# Patient Record
Sex: Female | Born: 1955
Health system: Southern US, Community
[De-identification: ages and names within clinical notes are randomized; demographics above are authoritative.]

## PROBLEM LIST (undated history)

## (undated) DIAGNOSIS — F329 Major depressive disorder, single episode, unspecified: Secondary | ICD-10-CM

## (undated) DIAGNOSIS — E785 Hyperlipidemia, unspecified: Secondary | ICD-10-CM

## (undated) DIAGNOSIS — G43909 Migraine, unspecified, not intractable, without status migrainosus: Secondary | ICD-10-CM

## (undated) DIAGNOSIS — M810 Age-related osteoporosis without current pathological fracture: Secondary | ICD-10-CM

## (undated) DIAGNOSIS — IMO0001 Reserved for inherently not codable concepts without codable children: Secondary | ICD-10-CM

## (undated) DIAGNOSIS — M8580 Other specified disorders of bone density and structure, unspecified site: Secondary | ICD-10-CM

## (undated) DIAGNOSIS — F419 Anxiety disorder, unspecified: Secondary | ICD-10-CM

## (undated) DIAGNOSIS — K219 Gastro-esophageal reflux disease without esophagitis: Secondary | ICD-10-CM

## (undated) DIAGNOSIS — F32A Depression, unspecified: Secondary | ICD-10-CM

## (undated) DIAGNOSIS — D219 Benign neoplasm of connective and other soft tissue, unspecified: Secondary | ICD-10-CM

## (undated) HISTORY — DX: Other specified disorders of bone density and structure, unspecified site: M85.80

## (undated) HISTORY — DX: Depression, unspecified: F32.A

## (undated) HISTORY — DX: Benign neoplasm of connective and other soft tissue, unspecified: D21.9

## (undated) HISTORY — DX: Gastro-esophageal reflux disease without esophagitis: K21.9

## (undated) HISTORY — DX: Migraine, unspecified, not intractable, without status migrainosus: G43.909

## (undated) HISTORY — DX: Anxiety disorder, unspecified: F41.9

## (undated) HISTORY — PX: PELVIC LAPAROSCOPY: SHX162

## (undated) HISTORY — DX: Hyperlipidemia, unspecified: E78.5

## (undated) HISTORY — DX: Age-related osteoporosis without current pathological fracture: M81.0

## (undated) HISTORY — PX: OVARIAN CYST REMOVAL: SHX89

## (undated) HISTORY — PX: DILATION AND CURETTAGE OF UTERUS: SHX78

## (undated) HISTORY — DX: Major depressive disorder, single episode, unspecified: F32.9

## (undated) HISTORY — PX: CARPAL TUNNEL RELEASE: SHX101

## (undated) HISTORY — DX: Reserved for inherently not codable concepts without codable children: IMO0001

---

## 1977-12-19 HISTORY — PX: WISDOM TOOTH EXTRACTION: SHX21

## 1998-08-20 ENCOUNTER — Ambulatory Visit (HOSPITAL_COMMUNITY): Admission: RE | Admit: 1998-08-20 | Discharge: 1998-08-20 | Payer: Self-pay | Admitting: Obstetrics and Gynecology

## 1999-03-04 ENCOUNTER — Other Ambulatory Visit: Admission: RE | Admit: 1999-03-04 | Discharge: 1999-03-04 | Payer: Self-pay | Admitting: Obstetrics and Gynecology

## 2000-06-07 ENCOUNTER — Other Ambulatory Visit: Admission: RE | Admit: 2000-06-07 | Discharge: 2000-06-07 | Payer: Self-pay | Admitting: Gynecology

## 2001-06-14 ENCOUNTER — Other Ambulatory Visit: Admission: RE | Admit: 2001-06-14 | Discharge: 2001-06-14 | Payer: Self-pay | Admitting: Gynecology

## 2002-07-04 ENCOUNTER — Other Ambulatory Visit: Admission: RE | Admit: 2002-07-04 | Discharge: 2002-07-04 | Payer: Self-pay | Admitting: Gynecology

## 2002-12-31 ENCOUNTER — Encounter: Admission: RE | Admit: 2002-12-31 | Discharge: 2002-12-31 | Payer: Self-pay | Admitting: Family Medicine

## 2002-12-31 ENCOUNTER — Encounter: Payer: Self-pay | Admitting: Family Medicine

## 2003-07-29 ENCOUNTER — Other Ambulatory Visit: Admission: RE | Admit: 2003-07-29 | Discharge: 2003-07-29 | Payer: Self-pay | Admitting: Gynecology

## 2004-07-30 ENCOUNTER — Other Ambulatory Visit: Admission: RE | Admit: 2004-07-30 | Discharge: 2004-07-30 | Payer: Self-pay | Admitting: Gynecology

## 2004-12-14 ENCOUNTER — Emergency Department (HOSPITAL_COMMUNITY): Admission: EM | Admit: 2004-12-14 | Discharge: 2004-12-14 | Payer: Self-pay | Admitting: Emergency Medicine

## 2005-08-12 ENCOUNTER — Other Ambulatory Visit: Admission: RE | Admit: 2005-08-12 | Discharge: 2005-08-12 | Payer: Self-pay | Admitting: Obstetrics and Gynecology

## 2005-08-29 ENCOUNTER — Ambulatory Visit: Payer: Self-pay | Admitting: Internal Medicine

## 2005-09-13 ENCOUNTER — Ambulatory Visit: Payer: Self-pay | Admitting: Internal Medicine

## 2006-09-19 ENCOUNTER — Other Ambulatory Visit: Admission: RE | Admit: 2006-09-19 | Discharge: 2006-09-19 | Payer: Self-pay | Admitting: Gynecology

## 2008-02-11 ENCOUNTER — Other Ambulatory Visit: Admission: RE | Admit: 2008-02-11 | Discharge: 2008-02-11 | Payer: Self-pay | Admitting: Family Medicine

## 2008-10-23 ENCOUNTER — Ambulatory Visit: Payer: Self-pay | Admitting: Women's Health

## 2008-11-06 ENCOUNTER — Ambulatory Visit: Payer: Self-pay | Admitting: Gynecology

## 2008-11-06 ENCOUNTER — Encounter: Payer: Self-pay | Admitting: Gynecology

## 2008-12-29 ENCOUNTER — Ambulatory Visit: Payer: Self-pay | Admitting: Women's Health

## 2009-04-27 ENCOUNTER — Ambulatory Visit: Payer: Self-pay | Admitting: Women's Health

## 2009-11-25 ENCOUNTER — Other Ambulatory Visit: Admission: RE | Admit: 2009-11-25 | Discharge: 2009-11-25 | Payer: Self-pay | Admitting: Gynecology

## 2009-11-25 ENCOUNTER — Ambulatory Visit: Payer: Self-pay | Admitting: Women's Health

## 2009-12-24 ENCOUNTER — Ambulatory Visit: Payer: Self-pay | Admitting: Women's Health

## 2010-02-01 ENCOUNTER — Ambulatory Visit: Payer: Self-pay | Admitting: Women's Health

## 2010-08-13 ENCOUNTER — Encounter: Payer: Self-pay | Admitting: Internal Medicine

## 2011-01-20 NOTE — Letter (Signed)
Summary: Colonoscopy Letter  [facility]  [address] [place], [place] [address]   Phone: [phone]  Fax: [phone]      August 13, 2010 MRN: [id number]   [facility] [address] [place], [place]  [address]   Dear Ms. Sedalia Muta,   According to your medical record, it is time for you to schedule a Colonoscopy. The American Cancer Society recommends this procedure as a method to detect early colon cancer. Patients with a family history of colon cancer, or a personal history of colon polyps or inflammatory bowel disease are at increased risk.  This letter has been generated based on the recommendations made at the time of your procedure. If you feel that in your particular situation this may no longer apply, please contact our office.  Please call our office at [phone] to schedule this appointment or to update your records at your earliest convenience.  Thank you for cooperating with [place] to provide you with the very best care possible.   Sincerely,  Wilhemina Bonito. Marina Goodell, M.D.  [facility] [phone]

## 2011-01-31 ENCOUNTER — Other Ambulatory Visit: Payer: Self-pay | Admitting: Women's Health

## 2011-01-31 ENCOUNTER — Other Ambulatory Visit (HOSPITAL_COMMUNITY)
Admission: RE | Admit: 2011-01-31 | Discharge: 2011-01-31 | Disposition: A | Discharge status: Home / Self Care | Payer: 59 | Source: Ambulatory Visit | Attending: Gynecology | Admitting: Gynecology

## 2011-01-31 ENCOUNTER — Encounter: Payer: 59 | Admitting: Women's Health

## 2011-01-31 DIAGNOSIS — Z124 Encounter for screening for malignant neoplasm of cervix: Secondary | ICD-10-CM

## 2011-01-31 DIAGNOSIS — N951 Menopausal and female climacteric states: Secondary | ICD-10-CM

## 2011-08-22 ENCOUNTER — Other Ambulatory Visit: Payer: Self-pay | Admitting: Women's Health

## 2011-08-24 NOTE — Telephone Encounter (Signed)
Michele [name], was her annual in February. If so we renew Wellbutrin 150 for 6 months.  Thanks.

## 2011-12-20 HISTORY — PX: COLONOSCOPY: SHX174

## 2012-02-01 ENCOUNTER — Encounter: Payer: Self-pay | Admitting: Women's Health

## 2012-02-02 ENCOUNTER — Other Ambulatory Visit: Payer: Self-pay | Admitting: *Deleted

## 2012-02-02 DIAGNOSIS — R928 Other abnormal and inconclusive findings on diagnostic imaging of breast: Secondary | ICD-10-CM

## 2012-02-03 ENCOUNTER — Other Ambulatory Visit: Payer: Self-pay | Admitting: Women's Health

## 2012-02-03 DIAGNOSIS — R928 Other abnormal and inconclusive findings on diagnostic imaging of breast: Secondary | ICD-10-CM

## 2012-02-29 ENCOUNTER — Ambulatory Visit (INDEPENDENT_AMBULATORY_CARE_PROVIDER_SITE_OTHER): Payer: 59 | Admitting: Women's Health

## 2012-02-29 ENCOUNTER — Encounter: Payer: Self-pay | Admitting: Women's Health

## 2012-02-29 ENCOUNTER — Other Ambulatory Visit (HOSPITAL_COMMUNITY)
Admission: RE | Admit: 2012-02-29 | Discharge: 2012-02-29 | Disposition: A | Discharge status: Home / Self Care | Payer: 59 | Source: Ambulatory Visit | Attending: Obstetrics and Gynecology | Admitting: Obstetrics and Gynecology

## 2012-02-29 VITALS — BP 110/72 | Ht 63.0 in | Wt 141.0 lb

## 2012-02-29 DIAGNOSIS — Z8669 Personal history of other diseases of the nervous system and sense organs: Secondary | ICD-10-CM | POA: Insufficient documentation

## 2012-02-29 DIAGNOSIS — Z01419 Encounter for gynecological examination (general) (routine) without abnormal findings: Secondary | ICD-10-CM | POA: Insufficient documentation

## 2012-02-29 DIAGNOSIS — Z833 Family history of diabetes mellitus: Secondary | ICD-10-CM

## 2012-02-29 DIAGNOSIS — Z13220 Encounter for screening for lipoid disorders: Secondary | ICD-10-CM

## 2012-02-29 DIAGNOSIS — F419 Anxiety disorder, unspecified: Secondary | ICD-10-CM | POA: Insufficient documentation

## 2012-02-29 DIAGNOSIS — Z780 Asymptomatic menopausal state: Secondary | ICD-10-CM

## 2012-02-29 DIAGNOSIS — F341 Dysthymic disorder: Secondary | ICD-10-CM

## 2012-02-29 DIAGNOSIS — N951 Menopausal and female climacteric states: Secondary | ICD-10-CM

## 2012-02-29 DIAGNOSIS — F329 Major depressive disorder, single episode, unspecified: Secondary | ICD-10-CM

## 2012-02-29 DIAGNOSIS — F32A Depression, unspecified: Secondary | ICD-10-CM

## 2012-02-29 MED ORDER — MEDROXYPROGESTERONE ACETATE 2.5 MG PO TABS: 2.5000 mg | ORAL_TABLET | Freq: Every day | ORAL | Status: DC

## 2012-02-29 MED ORDER — ESTRADIOL 0.5 MG PO TABS: 0.5000 mg | ORAL_TABLET | Freq: Every day | ORAL | Status: DC

## 2012-02-29 NOTE — Progress Notes (Signed)
Michele [name] 07-06-1956 [id number]    History:    The patient presents for annual exam.  Postmenopausal with no bleeding on estradiol 0.5 and Provera 2.5 daily with good relief of symptoms. History of anxiety and depression stable on Wellbutrin 150.` History of normal Paps, Pap 2012 absent endocervical cells. History of normal mammograms. Normal colonoscopy. Normal DEXA in 2010.   Past medical history, past surgical history, family history and social history were all reviewed and documented in the EPIC chart. Son Ebony Cargo age 2 and doing well. Both parents in 39s with numerous health problems. History of increased cholesterol in the past, was on Crestor, last lipid profiles normal.   ROS:  A  ROS was performed and pertinent positives and negatives are included in the history.  Exam:  Filed Vitals:   02/29/12 1531  BP: 110/72    General appearance:  Normal Head/Neck:  Normal, without cervical or supraclavicular adenopathy. Thyroid:  Symmetrical, normal in size, without palpable masses or nodularity. Respiratory  Effort:  Normal  Auscultation:  Clear without wheezing or rhonchi Cardiovascular  Auscultation:  Regular rate, without rubs, murmurs or gallops  Edema/varicosities:  Not grossly evident Abdominal  Soft,nontender, without masses, guarding or rebound.  Liver/spleen:  No organomegaly noted  Hernia:  None appreciated  Skin  Inspection:  Grossly normal  Palpation:  Grossly normal Neurologic/psychiatric  Orientation:  Normal with appropriate conversation.  Mood/affect:  Normal  Genitourinary    Breasts: Examined lying and sitting.     Right: Without masses, retractions, discharge or axillary adenopathy.     Left: Without masses, retractions, discharge or axillary adenopathy.   Inguinal/mons:  Normal without inguinal adenopathy  External genitalia:  Normal  BUS/Urethra/Skene's glands:  Normal  Bladder:  Normal  Vagina:  Normal  Cervix:  Normal  Uterus:   normal in  size, shape and contour.  Midline and mobile  Adnexa/parametria:     Rt: Without masses or tenderness.   Lt: Without masses or tenderness.  Anus and perineum: Normal  Digital rectal exam: Normal sphincter tone without palpated masses or tenderness  Assessment/Plan:  56 y.o. DW F. G2 P1  for annual exam.  Normal postmenopausal exam on HRT Anxiety/depression-stable on Wellbutrin  Plan: Estradiol 0.5, Provera 2.5 daily prescriptions, proper use, slight risk for blood clots, strokes, breast cancer reviewed. SBE's, annual mammogram, calcium rich diet, vitamin D 2000 daily and exercise encouraged. CBC, glucose, lipid profile, UA and Pap. DEXA next year.     Harrington Challenger [facility], 5:45 PM 02/29/2012

## 2012-02-29 NOTE — Patient Instructions (Signed)

## 2012-03-01 LAB — URINALYSIS W MICROSCOPIC + REFLEX CULTURE
Bacteria, UA: NONE SEEN
Bilirubin Urine: NEGATIVE
Protein, ur: NEGATIVE mg/dL
Urobilinogen, UA: 0.2 mg/dL (ref 0.0–1.0)

## 2012-03-01 LAB — CBC WITH DIFFERENTIAL/PLATELET
HCT: 40.4 % (ref 36.0–46.0)
Hemoglobin: 13.4 g/dL (ref 12.0–15.0)
Lymphocytes Relative: 34 % (ref 12–46)
Lymphs Abs: 1.9 10*3/uL (ref 0.7–4.0)
Monocytes Absolute: 0.5 10*3/uL (ref 0.1–1.0)
Monocytes Relative: 10 % (ref 3–12)
Neutro Abs: 3.1 10*3/uL (ref 1.7–7.7)
WBC: 5.6 10*3/uL (ref 4.0–10.5)

## 2012-03-01 LAB — LIPID PANEL
Cholesterol: 141 mg/dL (ref 0–200)
Total CHOL/HDL Ratio: 3.4 Ratio
Triglycerides: 79 mg/dL (ref <150)
VLDL: 16 mg/dL (ref 0–40)

## 2012-08-08 ENCOUNTER — Encounter (HOSPITAL_COMMUNITY): Payer: Self-pay | Admitting: Emergency Medicine

## 2012-08-08 ENCOUNTER — Emergency Department (HOSPITAL_COMMUNITY)
Admission: EM | Admit: 2012-08-08 | Discharge: 2012-08-09 | Disposition: A | Discharge status: Home / Self Care | Payer: 59 | Attending: Emergency Medicine | Admitting: Emergency Medicine

## 2012-08-08 ENCOUNTER — Emergency Department (HOSPITAL_COMMUNITY): Payer: 59

## 2012-08-08 DIAGNOSIS — R42 Dizziness and giddiness: Secondary | ICD-10-CM | POA: Insufficient documentation

## 2012-08-08 DIAGNOSIS — F341 Dysthymic disorder: Secondary | ICD-10-CM | POA: Insufficient documentation

## 2012-08-08 DIAGNOSIS — R55 Syncope and collapse: Secondary | ICD-10-CM | POA: Insufficient documentation

## 2012-08-08 LAB — URINALYSIS, ROUTINE W REFLEX MICROSCOPIC
Glucose, UA: NEGATIVE mg/dL
Ketones, ur: NEGATIVE mg/dL
pH: 6 (ref 5.0–8.0)

## 2012-08-08 LAB — COMPREHENSIVE METABOLIC PANEL
ALT: 17 U/L (ref 0–35)
Albumin: 4.1 g/dL (ref 3.5–5.2)
Alkaline Phosphatase: 81 U/L (ref 39–117)
BUN: 18 mg/dL (ref 6–23)
Potassium: 3.8 mEq/L (ref 3.5–5.1)
Sodium: 140 mEq/L (ref 135–145)
Total Protein: 6.6 g/dL (ref 6.0–8.3)

## 2012-08-08 LAB — CBC WITH DIFFERENTIAL/PLATELET
Basophils Relative: 1 % (ref 0–1)
Eosinophils Absolute: 0.1 10*3/uL (ref 0.0–0.7)
MCH: 29.4 pg (ref 26.0–34.0)
MCHC: 34.1 g/dL (ref 30.0–36.0)
Neutrophils Relative %: 73 % (ref 43–77)
Platelets: 218 10*3/uL (ref 150–400)

## 2012-08-08 LAB — URINE MICROSCOPIC-ADD ON

## 2012-08-08 LAB — GLUCOSE, CAPILLARY

## 2012-08-08 MED ORDER — SODIUM CHLORIDE 0.9 % IV SOLN
INTRAVENOUS | Status: DC
  Administered 2012-08-08: 22:00:00 via INTRAVENOUS

## 2012-08-08 NOTE — ED Provider Notes (Signed)
History     CSN: [id number]  Arrival date & time 08/08/12  5284   First MD Initiated Contact with Patient 08/08/12 2040      Chief Complaint  Patient presents with  . Dizziness    (Consider location/radiation/quality/duration/timing/severity/associated sxs/prior treatment) HPI  Patient presents to the ER from a restaurant for near syncope. She states that she was sitting at the table, had had a half glass of wine when her vision started to have black dots, she felt light headed, noises were distant, her hands went into a rigid spasm, she became diaphoretic and felt as though she was going to pass out. She states that this happened two weeks ago while at a restaurant after eating and having a glass of wine. The past episode was associated with vomiting and diarrhea, so she felt as though it were food poisoning. She denies having any problems drinking wine in the past. She denies a family history of cardiac or strokes. She is otherwise healthy and no longer has migraines since going through menopause. She denies talking a bout something evoking anxiety or feeling anxious, or focal weakness. She denies taking any medications. NAD and VSS  Past Medical History  Diagnosis Date  . Anxiety   . Depression   . Reflux   . Migraines   . Endometriosis   . Fibroid     Past Surgical History  Procedure Date  . Cesarean section   . Dilation and curettage of uterus     4 0R 5 DONE SINCE HER EARLY 20'S FOR ENDOMETRIOSIS  . Pelvic laparoscopy     EXPLORATORY   . Pelvic laparoscopy     FOR ECTOPIC    Family History  Problem Relation Age of Onset  . Cancer Maternal Grandmother     COLON    History  Substance Use Topics  . Smoking status: Former Games developer  . Smokeless tobacco: Never Used  . Alcohol Use: Yes     RARE    OB History    Grav Para Term Preterm Abortions TAB SAB Ect Mult Living   2 1   1   1         Review of Systems   HEENT: denies blurry vision or change in  hearing PULMONARY: Denies difficulty breathing and SOB CARDIAC: denies chest pain or heart palpitations MUSCULOSKELETAL:  denies being unable to ambulate ABDOMEN AL: denies abdominal pain GU: denies loss of bowel or urinary control NEURO: denies numbness and tingling in extremities SKIN: no new rashes PSYCH: patient denies anxiety or depression. NECK: Pt denies having neck pain     Allergies  Ivp dye  Home Medications  No current outpatient prescriptions on file.  BP 120/78  Pulse 72  Temp 98 F (36.7 C) (Oral)  Resp 16  SpO2 100%  Physical Exam  Nursing note and vitals reviewed. Constitutional: She appears well-developed and well-nourished. No distress.  HENT:  Head: Normocephalic and atraumatic.  Eyes: Pupils are equal, round, and reactive to light.  Neck: Normal range of motion. Neck supple.  Cardiovascular: Normal rate and regular rhythm.   Pulmonary/Chest: Effort normal.  Abdominal: Soft.  Neurological: She is alert. No cranial nerve deficit (3-12 intact). GCS eye subscore is 4. GCS verbal subscore is 5. GCS motor subscore is 6.  Skin: Skin is warm and dry.    ED Course  Procedures (including critical care time)  Labs Reviewed  URINALYSIS, ROUTINE W REFLEX MICROSCOPIC - Abnormal; Notable for the following:  Leukocytes, UA TRACE (*)     All other components within normal limits  URINE MICROSCOPIC-ADD ON - Abnormal; Notable for the following:    Squamous Epithelial / LPF FEW (*)     All other components within normal limits  GLUCOSE, CAPILLARY  TROPONIN I  CBC WITH DIFFERENTIAL  COMPREHENSIVE METABOLIC PANEL   Dg Chest 2 View  08/08/2012  *RADIOLOGY REPORT*  Clinical Data: Near syncope, prior smoker  CHEST - 2 VIEW  Comparison: None.  Findings: Cardiomediastinal silhouette is within normal limits. The lungs are clear. No pleural effusion.  No pneumothorax.  No acute osseous abnormality.  IMPRESSION: Normal chest.   Original Report Authenticated By:  Harrel Lemon, M.D.    Ct Head Wo Contrast  08/08/2012  *RADIOLOGY REPORT*  Clinical Data:  Syncope  CT HEAD WITHOUT CONTRAST  Technique:  Contiguous axial images were obtained from the base of the skull through the vertex without contrast  Comparison:  None.  Findings:  The brain has a normal appearance without evidence for hemorrhage, acute infarction, hydrocephalus, or mass lesion.  There is no extra axial fluid collection.  The skull and paranasal sinuses are normal.  IMPRESSION: Normal CT of the head without contrast.   Original Report Authenticated By: Camelia Phenes, M.D.      1. Dizziness - light-headed       MDM   Date: 08/08/2012  Rate: 69  Rhythm: sinus tachycardia  QRS Axis: normal  Intervals: normal  ST/T Wave abnormalities: normal  Conduction Disutrbances:none  Narrative Interpretation:   Old EKG Reviewed: unchanged  Pt wrk up has returned unremarkable. Orthostatics normal Pt has a hx of depression, perhaps this was anxiety based. Not suspecting TIA. Dr. Effie Shy has seen patient and agrees she can follow-up with PCP for further work-up an evaluation.  Pt has been advised of the symptoms that warrant their return to the ED. Patient has voiced understanding and has agreed to follow-up with the PCP or specialist.         Dorthula Matas, PA 08/09/12 [phone]

## 2012-08-08 NOTE — ED Notes (Signed)
Pt alert, arrives from home, c/o dizziness, "cold sweats", nausea, onset was today while eating dinner, pt ambulates to triage, speech clear, denies H/A, resp even unlabored, skin pwd

## 2012-08-09 NOTE — ED Provider Notes (Signed)
Cleota Pellerito Kliewer is a 56 y.o. female with sudden onset of dizziness. Cold sweats, nausea, and bilateral carpal pedal spasm with tingling in both hands, while drinking wine. A similar episode occurred 2 weeks ago, but was associated with vomiting. No interval symptoms. No associated headache, chest pain, back pain, confusion, or suspected stress. Patient's husband was with her during the episode. He is here in the emergency department attributes to history. Exam she is mildly anxious. She is alert. She is cooperative. Neurologic exam is nonfocal. Respiratory effort is normal. She moves all extremities equally.   Medical screening examination/treatment/procedure(s) were conducted as a shared visit with non-physician practitioner(s) and myself.  I personally evaluated the patient during the encounter  Flint Melter, MD 08/09/12 [phone]

## 2012-08-13 ENCOUNTER — Other Ambulatory Visit: Payer: Self-pay | Admitting: Women's Health

## 2012-08-13 ENCOUNTER — Other Ambulatory Visit: Payer: Self-pay | Admitting: Family Medicine

## 2012-08-13 ENCOUNTER — Ambulatory Visit
Admission: RE | Admit: 2012-08-13 | Discharge: 2012-08-13 | Disposition: A | Discharge status: Home / Self Care | Payer: No Typology Code available for payment source | Source: Ambulatory Visit | Attending: Family Medicine | Admitting: Family Medicine

## 2012-08-13 DIAGNOSIS — M542 Cervicalgia: Secondary | ICD-10-CM

## 2012-08-13 DIAGNOSIS — R42 Dizziness and giddiness: Secondary | ICD-10-CM

## 2012-08-13 NOTE — Progress Notes (Signed)
Telephone call for followup of dizziness was seen in the ER on 8/21.Had a normal CT of the head, normal EKG and labs. Will  come to office for a TSH. Reviewed if dizziness persists we'll refer to neurologist. States no further dizziness.

## 2012-08-21 ENCOUNTER — Encounter: Payer: Self-pay | Admitting: Gastroenterology

## 2012-08-22 ENCOUNTER — Other Ambulatory Visit: Payer: 59

## 2012-08-22 ENCOUNTER — Telehealth: Payer: Self-pay | Admitting: *Deleted

## 2012-08-22 DIAGNOSIS — R42 Dizziness and giddiness: Secondary | ICD-10-CM

## 2012-08-22 LAB — THYROID PANEL WITH TSH
T4, Total: 8.3 ug/dL (ref 5.0–12.5)
TSH: 1.508 u[IU]/mL (ref 0.350–4.500)

## 2012-08-22 NOTE — Telephone Encounter (Signed)
okay

## 2012-08-22 NOTE — Telephone Encounter (Signed)
Lab informed.

## 2012-08-22 NOTE — Telephone Encounter (Signed)
Pt came in today for a TSH that [place] ordered due to dizziness. When the patient came in today she asked for a full thyroid panel. You are her back up physician since Harriett Sine isnt here I am asking you. Is this ok?

## 2012-08-22 NOTE — Addendum Note (Signed)
Addended by: Richardson Chiquito on: 08/22/2012 12:49 PM   Modules accepted: Orders

## 2012-09-04 ENCOUNTER — Encounter: Payer: Self-pay | Admitting: Internal Medicine

## 2012-09-04 ENCOUNTER — Other Ambulatory Visit: Payer: Self-pay | Admitting: Family Medicine

## 2012-09-04 DIAGNOSIS — M542 Cervicalgia: Secondary | ICD-10-CM

## 2012-09-07 ENCOUNTER — Other Ambulatory Visit: Payer: Self-pay | Admitting: Women's Health

## 2012-09-11 ENCOUNTER — Encounter: Payer: Self-pay | Admitting: Internal Medicine

## 2012-09-12 ENCOUNTER — Ambulatory Visit
Admission: RE | Admit: 2012-09-12 | Discharge: 2012-09-12 | Disposition: A | Discharge status: Home / Self Care | Payer: 59 | Source: Ambulatory Visit | Attending: Family Medicine | Admitting: Family Medicine

## 2012-09-12 DIAGNOSIS — M542 Cervicalgia: Secondary | ICD-10-CM

## 2012-09-28 ENCOUNTER — Ambulatory Visit (AMBULATORY_SURGERY_CENTER): Payer: 59 | Admitting: *Deleted

## 2012-09-28 ENCOUNTER — Encounter: Payer: Self-pay | Admitting: Gastroenterology

## 2012-09-28 VITALS — Ht 63.0 in | Wt 140.0 lb

## 2012-09-28 DIAGNOSIS — Z1211 Encounter for screening for malignant neoplasm of colon: Secondary | ICD-10-CM

## 2012-09-28 MED ORDER — MOVIPREP 100 G PO SOLR: ORAL | Status: DC

## 2012-10-10 ENCOUNTER — Ambulatory Visit (AMBULATORY_SURGERY_CENTER): Payer: 59 | Admitting: Gastroenterology

## 2012-10-10 ENCOUNTER — Encounter: Payer: Self-pay | Admitting: Gastroenterology

## 2012-10-10 VITALS — BP 103/67 | HR 62 | Temp 96.9°F | Resp 20 | Ht 63.0 in | Wt 140.0 lb

## 2012-10-10 DIAGNOSIS — Z1211 Encounter for screening for malignant neoplasm of colon: Secondary | ICD-10-CM

## 2012-10-10 MED ORDER — SODIUM CHLORIDE 0.9 % IV SOLN: 500.0000 mL | INTRAVENOUS | Status: DC

## 2012-10-10 NOTE — Patient Instructions (Addendum)
YOU HAD AN ENDOSCOPIC PROCEDURE TODAY AT THE [facility]: Refer to the procedure report that was given to you for any specific questions about what was found during the examination.  If the procedure report does not answer your questions, please call your gastroenterologist to clarify.  If you requested that your care partner not be given the details of your procedure findings, then the procedure report has been included in a sealed envelope for you to review at your convenience later.  YOU SHOULD EXPECT: Some feelings of bloating in the abdomen. Passage of more gas than usual.  Walking can help get rid of the air that was put into your GI tract during the procedure and reduce the bloating. If you had a lower endoscopy (such as a colonoscopy or flexible sigmoidoscopy) you may notice spotting of blood in your stool or on the toilet paper. If you underwent a bowel prep for your procedure, then you may not have a normal bowel movement for a few days.  DIET: Your first meal following the procedure should be a light meal and then it is ok to progress to your normal diet.  A half-sandwich or bowl of soup is an example of a good first meal.  Heavy or fried foods are harder to digest and may make you feel nauseous or bloated.  Likewise meals heavy in dairy and vegetables can cause extra gas to form and this can also increase the bloating.  Drink plenty of fluids but you should avoid alcoholic beverages for 24 hours.  ACTIVITY: Your care partner should take you home directly after the procedure.  You should plan to take it easy, moving slowly for the rest of the day.  You can resume normal activity the day after the procedure however you should NOT DRIVE or use heavy machinery for 24 hours (because of the sedation medicines used during the test).    SYMPTOMS TO REPORT IMMEDIATELY: A gastroenterologist can be reached at any hour.  During normal business hours, 8:30 AM to 5:00 PM Monday through Friday,  call [phone].  After hours and on weekends, please call the GI answering service at [phone] who will take a message and have the physician on call contact you.   Following lower endoscopy (colonoscopy or flexible sigmoidoscopy):  Excessive amounts of blood in the stool  Significant tenderness or worsening of abdominal pains  Swelling of the abdomen that is new, acute  Fever of 100F or higher    FOLLOW UP: If any biopsies were taken you will be contacted by phone or by letter within the next 1-3 weeks.  Call your gastroenterologist if you have not heard about the biopsies in 3 weeks.  Our staff will call the home number listed on your records the next business day following your procedure to check on you and address any questions or concerns that you may have at that time regarding the information given to you following your procedure. This is a courtesy call and so if there is no answer at the home number and we have not heard from you through the emergency physician on call, we will assume that you have returned to your regular daily activities without incident.  SIGNATURES/CONFIDENTIALITY: You and/or your care partner have signed paperwork which will be entered into your electronic medical record.  These signatures attest to the fact that that the information above on your After Visit Summary has been reviewed and is understood.  Full responsibility of the confidentiality   of this discharge information lies with you and/or your care-partner.     

## 2012-10-10 NOTE — Progress Notes (Signed)
Patient did not experience any of the following events: a burn prior to discharge; a fall within the facility; wrong site/side/patient/procedure/implant event; or a hospital transfer or hospital admission upon discharge from the facility. (G8907) Patient did not have preoperative order for IV antibiotic SSI prophylaxis. (G8918)  

## 2012-10-10 NOTE — Op Note (Signed)
 [facility] [address] [place], [address]   COLONOSCOPY PROCEDURE REPORT  PATIENT: Michele [name].  MR#: [id number] BIRTHDATE: 05/31/1956 , 56  yrs. old GENDER: Female ENDOSCOPIST: Rachael Fee, MD PROCEDURE DATE:  10/10/2012 PROCEDURE:   Colonoscopy, diagnostic ASA CLASS:   Class III INDICATIONS:grandparent had colon cancer; colonoscopy 2006 with Dr. Marina Goodell; normal, was recommeded to have repeat at 5 years for family history of colon cancer. MEDICATIONS: Fentanyl 75 mcg IV, Versed 8 mg IV, and These medications were titrated to patient response per physician's verbal order  DESCRIPTION OF PROCEDURE:   After the risks benefits and alternatives of the procedure were thoroughly explained, informed consent was obtained.  A digital rectal exam revealed no abnormalities of the rectum.   The LB [id number]  endoscope was introduced through the anus and advanced to the cecum, which was identified by both the appendix and ileocecal valve. No adverse events experienced.   The quality of the prep was good, using MoviPrep  The instrument was then slowly withdrawn as the colon was fully examined.   COLON FINDINGS: A normal appearing cecum, ileocecal valve, and appendiceal orifice were identified.  The ascending, hepatic flexure, transverse, splenic flexure, descending, sigmoid colon and rectum appeared unremarkable.  No polyps or cancers were seen. Retroflexed views revealed no abnormalities. The time to cecum=3 minutes 40 seconds.  Withdrawal time=6 minutes 08 seconds.  The scope was withdrawn and the procedure completed. COMPLICATIONS: There were no complications.  ENDOSCOPIC IMPRESSION: Normal colon No polyps or cancers  RECOMMENDATIONS: You should continue to follow colorectal cancer screening guidelines for "routine risk" patients with a repeat colonoscopy in 10 years. There is no need for FOBT (stool) testing for at least 5 years.   eSigned:   Rachael Fee, MD 10/10/2012 10:39 AM   cc: Maryelizabeth Rowan, MD

## 2012-10-11 ENCOUNTER — Telehealth: Payer: Self-pay | Admitting: *Deleted

## 2012-10-11 NOTE — Telephone Encounter (Signed)
  Follow up Call-  Call back number 10/10/2012  Post procedure Call Back phone  # [phone]  Permission to leave phone message Yes     Patient questions:  Do you have a fever, pain , or abdominal swelling? no Pain Score  0 *  Have you tolerated food without any problems? yes  Have you been able to return to your normal activities? yes  Do you have any questions about your discharge instructions: Diet   no Medications  no Follow up visit  no  Do you have questions or concerns about your Care? no  Actions: * If pain score is 4 or above: No action needed, pain <4.

## 2013-02-28 ENCOUNTER — Encounter: Payer: Self-pay | Admitting: Women's Health

## 2013-03-01 ENCOUNTER — Ambulatory Visit: Payer: 59 | Admitting: Women's Health

## 2013-03-01 ENCOUNTER — Other Ambulatory Visit (HOSPITAL_COMMUNITY)
Admission: RE | Admit: 2013-03-01 | Discharge: 2013-03-01 | Disposition: A | Discharge status: Home / Self Care | Payer: 59 | Source: Ambulatory Visit | Attending: Obstetrics and Gynecology | Admitting: Obstetrics and Gynecology

## 2013-03-01 ENCOUNTER — Encounter: Payer: Self-pay | Admitting: Women's Health

## 2013-03-01 VITALS — BP 106/70 | Ht 63.0 in | Wt 146.0 lb

## 2013-03-01 DIAGNOSIS — Z780 Asymptomatic menopausal state: Secondary | ICD-10-CM

## 2013-03-01 DIAGNOSIS — Z01419 Encounter for gynecological examination (general) (routine) without abnormal findings: Secondary | ICD-10-CM

## 2013-03-01 DIAGNOSIS — Z13220 Encounter for screening for lipoid disorders: Secondary | ICD-10-CM

## 2013-03-01 DIAGNOSIS — F329 Major depressive disorder, single episode, unspecified: Secondary | ICD-10-CM

## 2013-03-01 DIAGNOSIS — M542 Cervicalgia: Secondary | ICD-10-CM | POA: Insufficient documentation

## 2013-03-01 DIAGNOSIS — F32A Depression, unspecified: Secondary | ICD-10-CM

## 2013-03-01 LAB — LIPID PANEL
HDL: 55 mg/dL (ref >39)
LDL Cholesterol: 118 mg/dL — ABNORMAL HIGH (ref 0–99)
Total CHOL/HDL Ratio: 3.5 Ratio

## 2013-03-01 MED ORDER — CYCLOBENZAPRINE HCL 5 MG PO TABS: 5.0000 mg | ORAL_TABLET | ORAL | Status: DC | PRN

## 2013-03-01 MED ORDER — HYDROCODONE-IBUPROFEN 7.5-200 MG PO TABS: 0.5000 | ORAL_TABLET | ORAL | Status: DC | PRN

## 2013-03-01 MED ORDER — BUPROPION HCL ER (SR) 150 MG PO TB12: 150.0000 mg | ORAL_TABLET | Freq: Two times a day (BID) | ORAL | Status: DC

## 2013-03-01 NOTE — Patient Instructions (Addendum)
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
## 2013-03-02 LAB — URINALYSIS W MICROSCOPIC + REFLEX CULTURE
Hgb urine dipstick: NEGATIVE
Nitrite: NEGATIVE
Protein, ur: NEGATIVE mg/dL

## 2013-03-03 LAB — URINE CULTURE: Colony Count: 9000

## 2013-05-03 ENCOUNTER — Other Ambulatory Visit: Payer: Self-pay | Admitting: Women's Health

## 2013-05-03 NOTE — Telephone Encounter (Signed)
Rx called in [facility] 

## 2013-07-31 IMAGING — CT CT HEAD W/O CM
2 series · 16 of 30 positions shown, 20 images · non-contrast
Comparison: None.

CLINICAL DATA: Syncope

CT HEAD WITHOUT CONTRAST
TECHNIQUE: Contiguous axial images were obtained from the base of
the skull through the vertex without contrast

[Series 2: head w/o · axial · non-contrast · 0.46mm/px · z∈[-353,-228]mm · 13 of 31 slices shown, 17 images]
[im 3/31  brain]
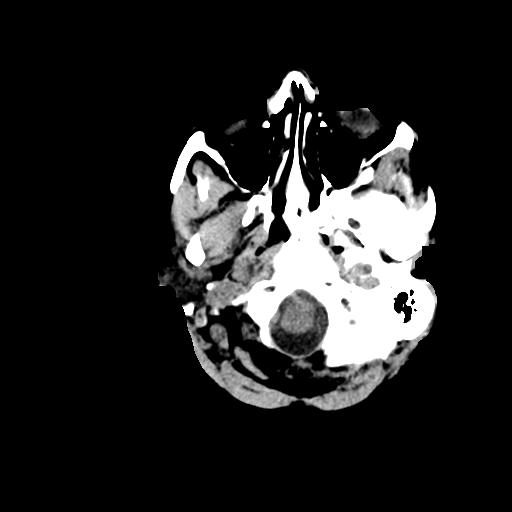
[im 3/31  bone]
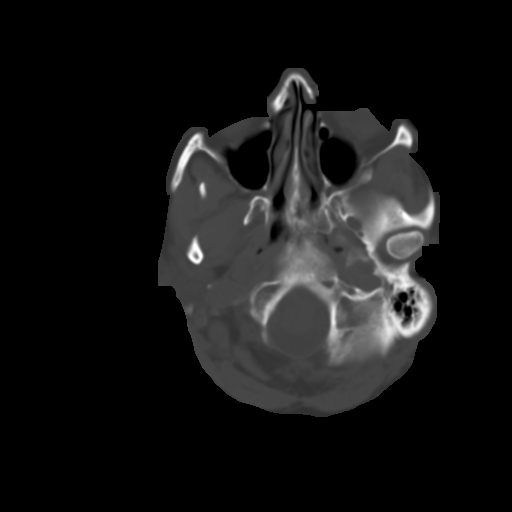
[im 5/31  brain]
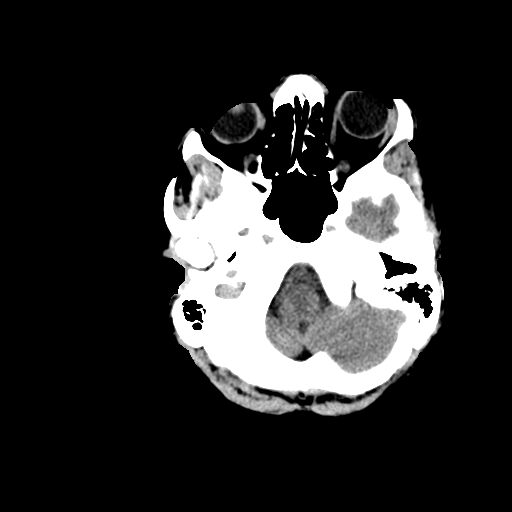
[im 7/31  brain]
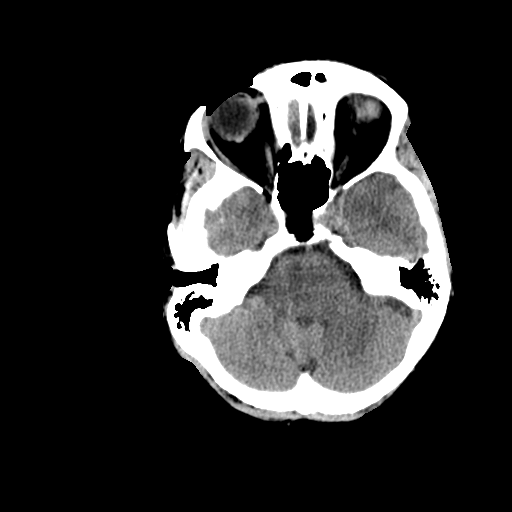
[im 9/31  brain]
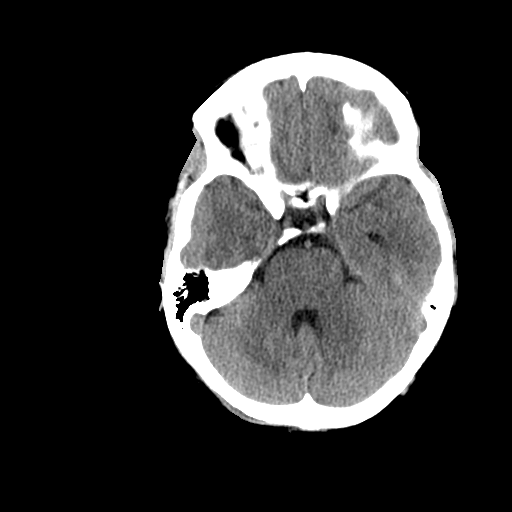
[im 11/31  brain]
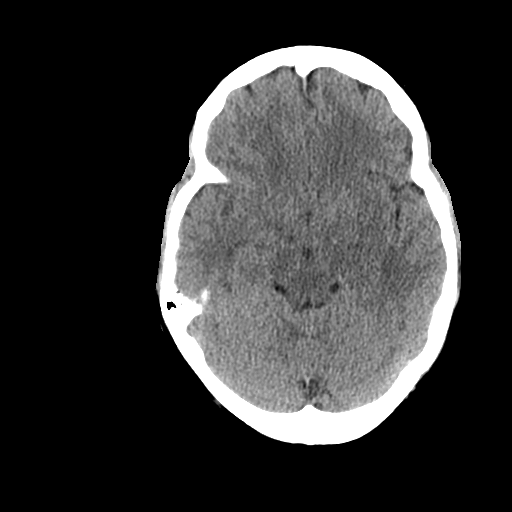
[im 11/31  bone]
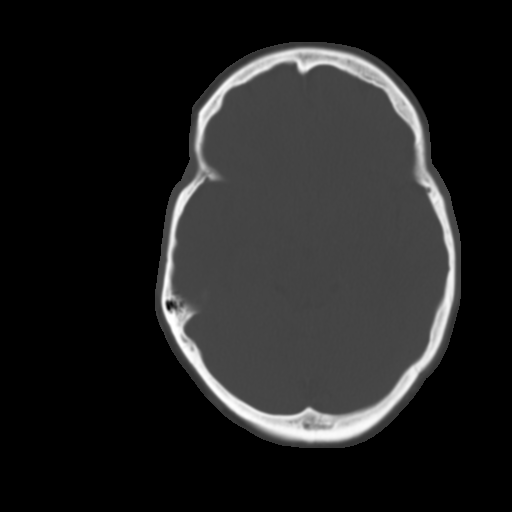
[im 13/31  brain]
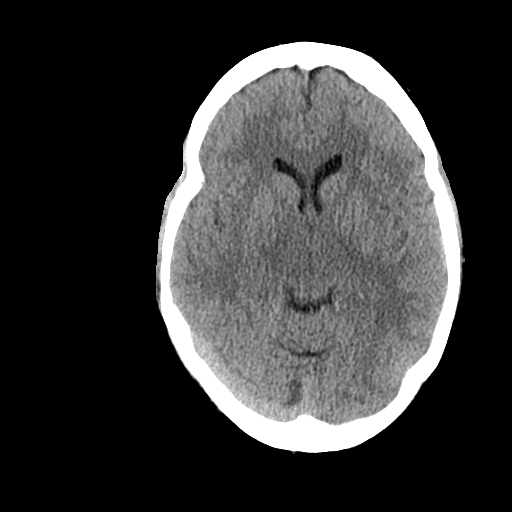
[im 16/31  brain]
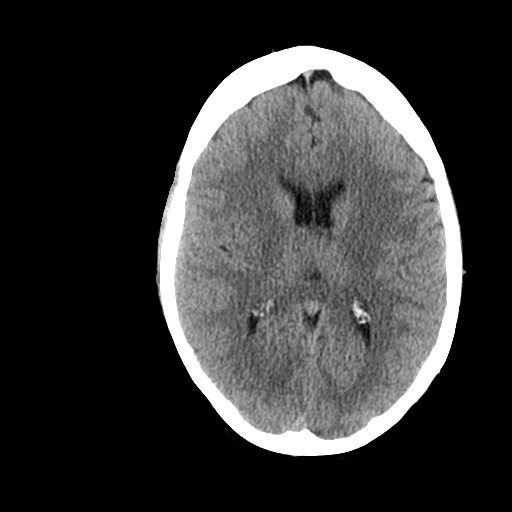
[im 18/31  brain]
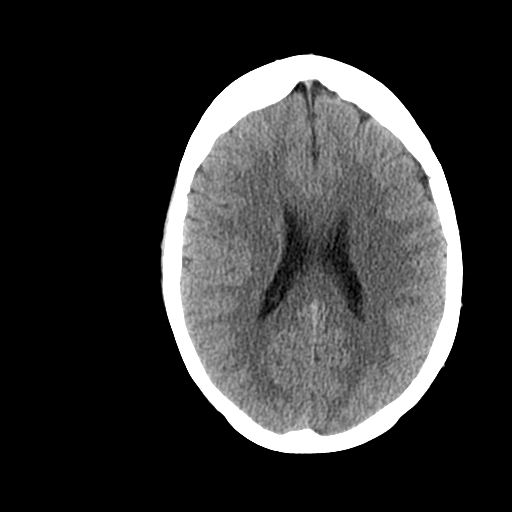
[im 20/31  brain]
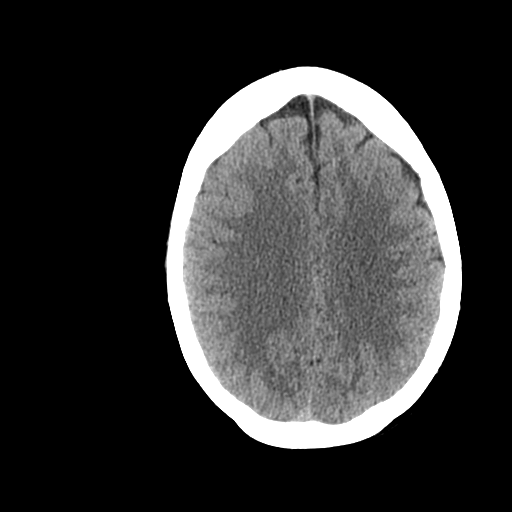
[im 20/31  bone]
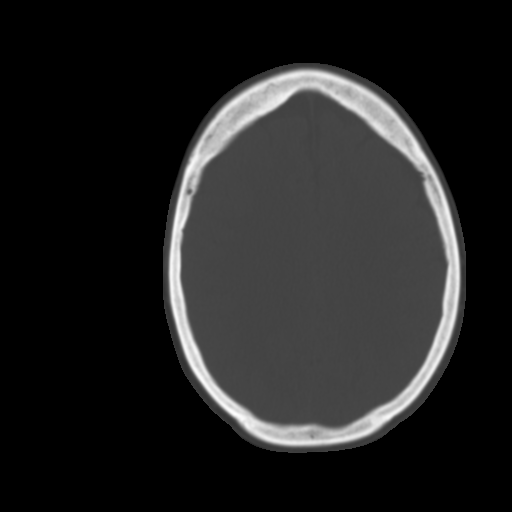
[im 22/31  brain]
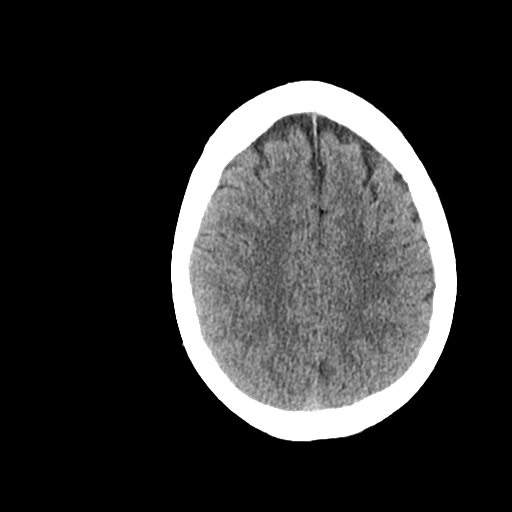
[im 24/31  brain]
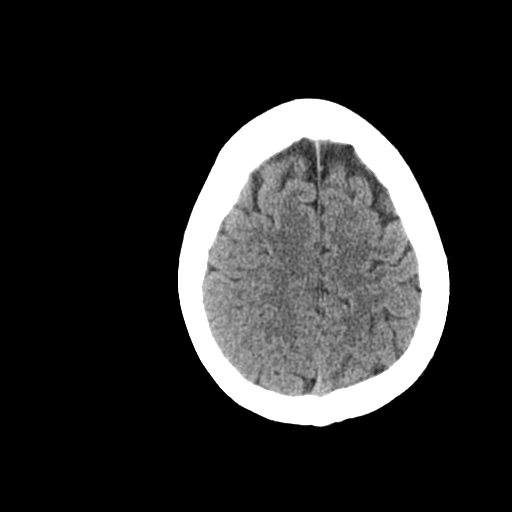
[im 26/31  brain]
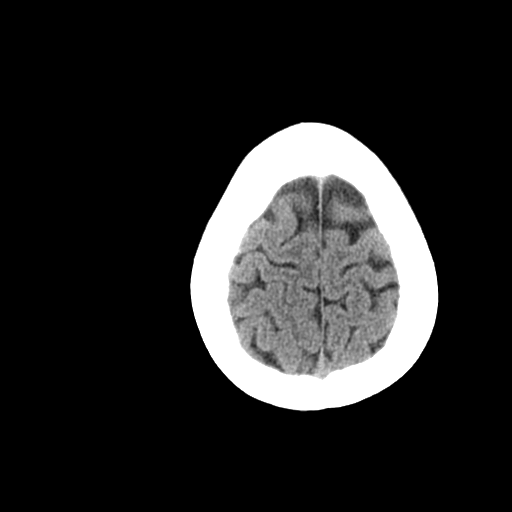
[im 28/31  brain]
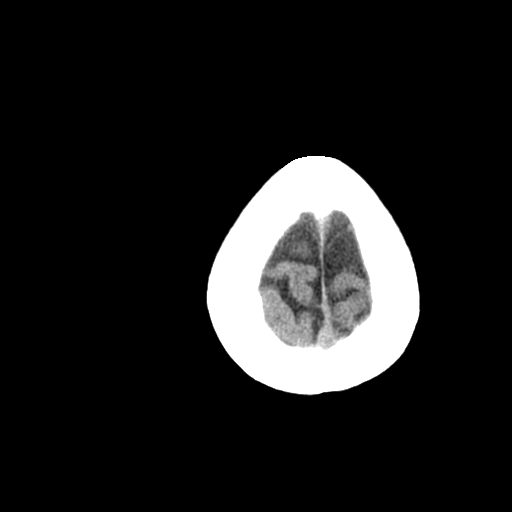
[im 28/31  bone]
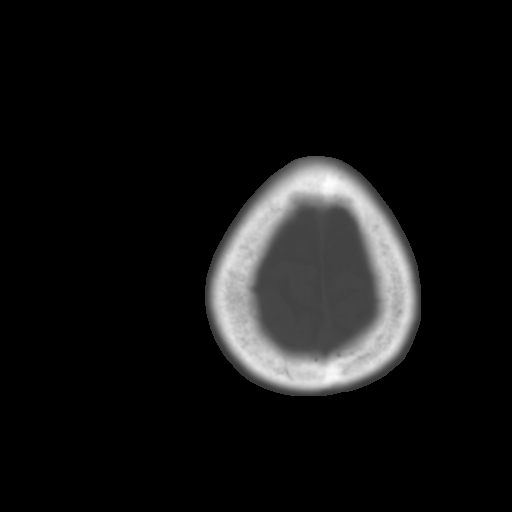

[Series 3: bone windows · axial · 0.46mm/px · z∈[-353,-313]mm · 3 of 31 slices shown]
[im 3/31  bone]
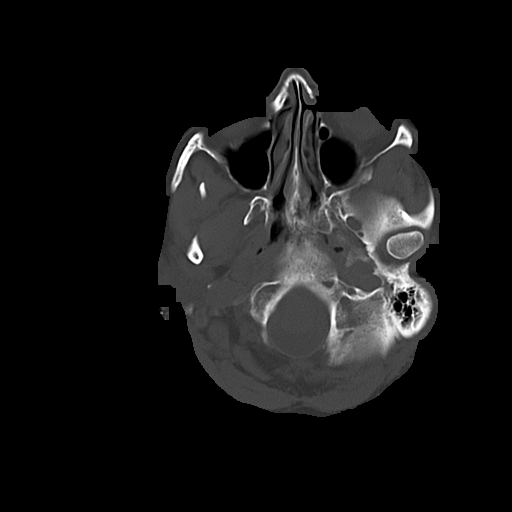
[im 7/31  bone]
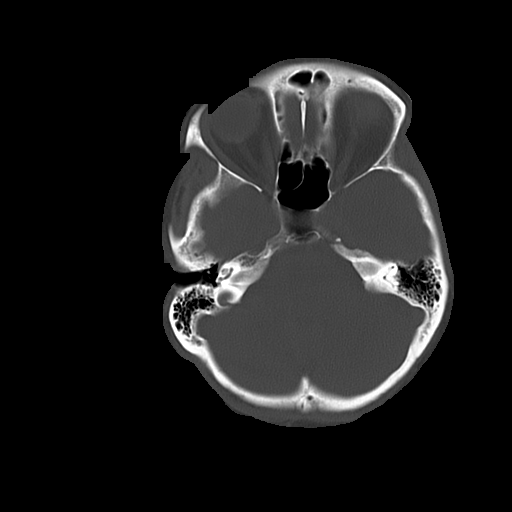
[im 11/31  bone]
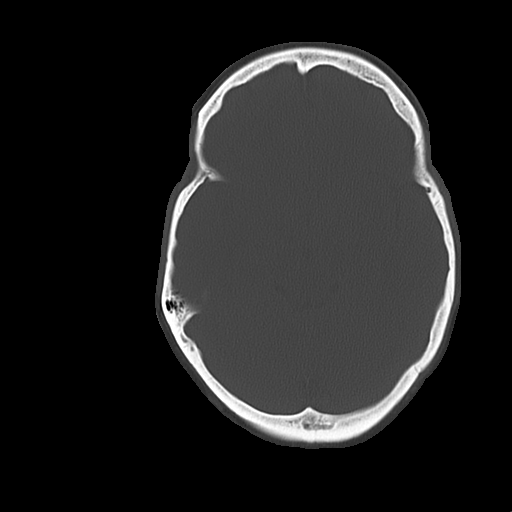

[16 of 30 positions shown; findings below may reference images not displayed]

FINDINGS: The brain has a normal appearance without evidence for
hemorrhage, acute infarction, hydrocephalus, or mass lesion.  There
is no extra axial fluid collection.  The skull and paranasal
sinuses are normal.
IMPRESSION: Normal CT of the head without contrast.

## 2013-08-26 ENCOUNTER — Telehealth: Payer: Self-pay | Admitting: *Deleted

## 2013-08-26 NOTE — Telephone Encounter (Signed)
Pt called to see if you are willing to give her Rx for Zostavax vaccine or if you think she would need this? Pt said she has had chicken pox as child and has had shingles in past. Mother had shingles frequently, and her sister has had it twice. Please advise

## 2013-08-26 NOTE — Telephone Encounter (Signed)
Pt said she is fine to pay for Rx if needed. I will mail Rx to pt.

## 2013-08-26 NOTE — Telephone Encounter (Signed)
Yes good idea to get, insurance will usually pay for when 60.  Check with insurance company to see if covered prior to 60.

## 2013-08-28 MED ORDER — ZOSTER VACCINE LIVE 19400 UNT/0.65ML ~~LOC~~ SOLR: 0.6500 mL | Freq: Once | SUBCUTANEOUS | Status: DC

## 2013-10-24 ENCOUNTER — Other Ambulatory Visit: Payer: Self-pay

## 2013-11-05 ENCOUNTER — Encounter: Payer: Self-pay | Admitting: Women's Health

## 2013-12-30 ENCOUNTER — Other Ambulatory Visit: Payer: Self-pay | Admitting: Dermatology

## 2014-02-20 ENCOUNTER — Encounter: Payer: Self-pay | Admitting: Family Medicine

## 2014-02-20 ENCOUNTER — Other Ambulatory Visit: Payer: Self-pay | Admitting: Family Medicine

## 2014-02-20 ENCOUNTER — Ambulatory Visit (INDEPENDENT_AMBULATORY_CARE_PROVIDER_SITE_OTHER): Payer: 59 | Admitting: Family Medicine

## 2014-02-20 VITALS — BP 118/82 | HR 75 | Temp 98.1°F | Resp 16 | Ht 63.0 in | Wt 145.2 lb

## 2014-02-20 DIAGNOSIS — Z23 Encounter for immunization: Secondary | ICD-10-CM

## 2014-02-20 DIAGNOSIS — Z0000 Encounter for general adult medical examination without abnormal findings: Secondary | ICD-10-CM

## 2014-02-20 DIAGNOSIS — Z1159 Encounter for screening for other viral diseases: Secondary | ICD-10-CM

## 2014-02-20 LAB — COMPLETE METABOLIC PANEL WITH GFR
ALK PHOS: 98 U/L (ref 39–117)
ALT: 15 U/L (ref 0–35)
AST: 16 U/L (ref 0–37)
Albumin: 4.4 g/dL (ref 3.5–5.2)
BILIRUBIN TOTAL: 0.6 mg/dL (ref 0.2–1.2)
BUN: 15 mg/dL (ref 6–23)
CALCIUM: 10.1 mg/dL (ref 8.4–10.5)
CHLORIDE: 105 meq/L (ref 96–112)
CO2: 24 mEq/L (ref 19–32)
CREATININE: 0.88 mg/dL (ref 0.50–1.10)
GFR, Est African American: 84 mL/min
GFR, Est Non African American: 73 mL/min
Glucose, Bld: 83 mg/dL (ref 70–99)
Potassium: 4.4 mEq/L (ref 3.5–5.3)
Sodium: 139 mEq/L (ref 135–145)
Total Protein: 7.2 g/dL (ref 6.0–8.3)

## 2014-02-20 LAB — POCT URINALYSIS DIPSTICK
BILIRUBIN UA: NEGATIVE
GLUCOSE UA: NEGATIVE
KETONES UA: NEGATIVE
Leukocytes, UA: NEGATIVE
NITRITE UA: NEGATIVE
PH UA: 5.5
Protein, UA: NEGATIVE
RBC UA: NEGATIVE
Urobilinogen, UA: 0.2

## 2014-02-20 LAB — LIPID PANEL
CHOL/HDL RATIO: 3.4 ratio
CHOLESTEROL: 220 mg/dL — AB (ref 0–200)
HDL: 64 mg/dL (ref >39)
LDL CALC: 140 mg/dL — AB (ref 0–99)
TRIGLYCERIDES: 81 mg/dL (ref <150)
VLDL: 16 mg/dL (ref 0–40)

## 2014-02-20 LAB — HEPATITIS C ANTIBODY: HCV Ab: NEGATIVE

## 2014-02-20 MED ORDER — ZOSTER VACCINE LIVE 19400 UNT/0.65ML ~~LOC~~ SOLR: 0.6500 mL | Freq: Once | SUBCUTANEOUS | Status: DC

## 2014-02-20 NOTE — Patient Instructions (Addendum)
Tetanus, Diphtheria, Pertussis (Tdap) Vaccine What You Need to Know WHY GET VACCINATED? Tetanus, diphtheria and pertussis can be very serious diseases, even for adolescents and adults. Tdap vaccine can protect us from these diseases. TETANUS (Lockjaw) causes painful muscle tightening and stiffness, usually all over the body.  It can lead to tightening of muscles in the head and neck so you can't open your mouth, swallow, or sometimes even breathe. Tetanus kills about 1 out of 5 people who are infected. DIPHTHERIA can cause a thick coating to form in the back of the throat.  It can lead to breathing problems, paralysis, heart failure, and death. PERTUSSIS (Whooping Cough) causes severe coughing spells, which can cause difficulty breathing, vomiting and disturbed sleep.  It can also lead to weight loss, incontinence, and rib fractures. Up to 2 in 100 adolescents and 5 in 100 adults with pertussis are hospitalized or have complications, which could include pneumonia and death. These diseases are caused by bacteria. Diphtheria and pertussis are spread from person to person through coughing or sneezing. Tetanus enters the body through cuts, scratches, or wounds. Before vaccines, the [place] saw as many as 200,000 cases a year of diphtheria and pertussis, and hundreds of cases of tetanus. Since vaccination began, tetanus and diphtheria have dropped by about 99% and pertussis by about 80%. TDAP VACCINE Tdap vaccine can protect adolescents and adults from tetanus, diphtheria, and pertussis. One dose of Tdap is routinely given at age 11 or 12. People who did not get Tdap at that age should get it as soon as possible. Tdap is especially important for health care professionals and anyone having close contact with a baby younger than 12 months. Pregnant women should get a dose of Tdap during every pregnancy, to protect the newborn from pertussis. Infants are most at risk for severe, life-threatening  complications from pertussis. A similar vaccine, called Td, protects from tetanus and diphtheria, but not pertussis. A Td booster should be given every 10 years. Tdap may be given as one of these boosters if you have not already gotten a dose. Tdap may also be given after a severe cut or burn to prevent tetanus infection. Your doctor can give you more information. Tdap may safely be given at the same time as other vaccines. SOME PEOPLE SHOULD NOT GET THIS VACCINE  If you ever had a life-threatening allergic reaction after a dose of any tetanus, diphtheria, or pertussis containing vaccine, OR if you have a severe allergy to any part of this vaccine, you should not get Tdap. Tell your doctor if you have any severe allergies.  If you had a coma, or long or multiple seizures within 7 days after a childhood dose of DTP or DTaP, you should not get Tdap, unless a cause other than the vaccine was found. You can still get Td.  Talk to your doctor if you:  have epilepsy or another nervous system problem,  had severe pain or swelling after any vaccine containing diphtheria, tetanus or pertussis,  ever had Guillain-Barr Syndrome (GBS),  aren't feeling well on the day the shot is scheduled. RISKS OF A VACCINE REACTION With any medicine, including vaccines, there is a chance of side effects. These are usually mild and go away on their own, but serious reactions are also possible. Brief fainting spells can follow a vaccination, leading to injuries from falling. Sitting or lying down for about 15 minutes can help prevent these. Tell your doctor if you feel dizzy or light-headed, or   have vision changes or ringing in the ears. Mild problems following Tdap (Did not interfere with activities)  Pain where the shot was given (about 3 in 4 adolescents or 2 in 3 adults)  Redness or swelling where the shot was given (about 1 person in 5)  Mild fever of at least 100.55F (up to about 1 in 25 adolescents or 1 in  100 adults)  Headache (about 3 or 4 people in 10)  Tiredness (about 1 person in 3 or 4)  Nausea, vomiting, diarrhea, stomach ache (up to 1 in 4 adolescents or 1 in 10 adults)  Chills, body aches, sore joints, rash, swollen glands (uncommon) Moderate problems following Tdap (Interfered with activities, but did not require medical attention)  Pain where the shot was given (about 1 in 5 adolescents or 1 in 100 adults)  Redness or swelling where the shot was given (up to about 1 in 16 adolescents or 1 in 25 adults)  Fever over 102F (about 1 in 100 adolescents or 1 in 250 adults)  Headache (about 3 in 20 adolescents or 1 in 10 adults)  Nausea, vomiting, diarrhea, stomach ache (up to 1 or 3 people in 100)  Swelling of the entire arm where the shot was given (up to about 3 in 100). Severe problems following Tdap (Unable to perform usual activities, required medical attention)  Swelling, severe pain, bleeding and redness in the arm where the shot was given (rare). A severe allergic reaction could occur after any vaccine (estimated less than 1 in a million doses). WHAT IF THERE IS A SERIOUS REACTION? What should I look for?  Look for anything that concerns you, such as signs of a severe allergic reaction, very high fever, or behavior changes. Signs of a severe allergic reaction can include hives, swelling of the face and throat, difficulty breathing, a fast heartbeat, dizziness, and weakness. These would start a few minutes to a few hours after the vaccination. What should I do?  If you think it is a severe allergic reaction or other emergency that can't wait, call 9-1-1 or get the person to the nearest hospital. Otherwise, call your doctor.  Afterward, the reaction should be reported to the "Vaccine Adverse Event Reporting System" (VAERS). Your doctor might file this report, or you can do it yourself through the VAERS web site at www.vaers.SamedayNews.es, or by calling 343 605 6302. VAERS is  only for reporting reactions. They do not give medical advice.  THE NATIONAL VACCINE INJURY COMPENSATION PROGRAM The National Vaccine Injury Compensation Program (VICP) is a federal program that was created to compensate people who may have been injured by certain vaccines. Persons who believe they may have been injured by a vaccine can learn about the program and about filing a claim by calling 317-180-3357 or visiting the [place] website at GoldCloset.com.ee. HOW CAN I LEARN MORE?  Ask your doctor.  Call your local or state health department.  Contact the Centers for Disease Control and Prevention (CDC):  Call 262-621-9231 or visit CDC's website at http://hunter.com/. CDC Tdap Vaccine VIS (04/26/12) Document Released: 06/05/2012 Document Revised: 04/01/2013 Document Reviewed: 03/27/2013 [facility] Patient Information 2014 [place], [place].     Keeping You Healthy  Get These Tests  Blood Pressure- Have your blood pressure checked by your healthcare provider at least once a year.  Normal blood pressure is 120/80.  Weight- Have your body mass index (BMI) calculated to screen for obesity.  BMI is a measure of body fat based on height and weight.  You can calculate your own BMI at GravelBags.it  Cholesterol- Have your cholesterol checked every year.  Diabetes- Have your blood sugar checked every year if you have high blood pressure, high cholesterol, a family history of diabetes or if you are overweight.  Pap Smear- Have a pap smear every 1 to 3 years if you have been sexually active.  If you are older than 65 and recent pap smears have been normal you may not need additional pap smears.  In addition, if you have had a hysterectomy  For benign disease additional pap smears are not necessary.  Mammogram-Yearly mammograms are essential for early detection of breast cancer  Screening for Colon Cancer- Colonoscopy starting at age 62. Screening may begin sooner  depending on your family history and other health conditions.  Follow up colonoscopy as directed by your Gastroenterologist.  Screening for Osteoporosis- Screening begins at age 58 with bone density scanning, sooner if you are at higher risk for developing Osteoporosis.  Get these medicines  Calcium with Vitamin D- Your body requires 1200-1500 mg of Calcium a day and [phone] IU of Vitamin D a day.  You can only absorb 500 mg of Calcium at a time therefore Calcium must be taken in 2 or 3 separate doses throughout the day.  Hormones- Hormone therapy has been associated with increased risk for certain cancers and heart disease.  Talk to your healthcare provider about if you need relief from menopausal symptoms.  Aspirin- Ask your healthcare provider about taking Aspirin to prevent Heart Disease and Stroke.  Get these Immuniztions  Flu shot- Every fall  Pneumonia shot- Once after the age of 19; if you are younger ask your healthcare provider if you need a pneumonia shot.  Tetanus- Every ten years. Tdap was given today. Next tetanus is due in 10 years.  Zostavax- Once after the age of 7 to prevent shingles. You received a prescription for this vaccine; I would recommend you wait at least a month to get this since you received Tdap vaccine today.  Take these steps  Don't smoke- Your healthcare provider can help you quit. For tips on how to quit, ask your healthcare provider or go to www.smokefree.gov or call 1-800 QUIT-NOW.  Be physically active- Exercise 5 days a week for a minimum of 30 minutes.  If you are not already physically active, start slow and gradually work up to 30 minutes of moderate physical activity.  Try walking, dancing, bike riding, swimming, etc.  Eat a healthy diet- Eat a variety of healthy foods such as fruits, vegetables, whole grains, low fat milk, low fat cheeses, yogurt, lean meats, chicken, fish, eggs, dried beans, tofu, etc.  For more information go to  www.thenutritionsource.org  Dental visit- Brush and floss teeth twice daily; visit your dentist twice a year.  Eye exam- Visit your Optometrist or Ophthalmologist yearly.  Drink alcohol in moderation- Limit alcohol intake to one drink or less a day.  Never drink and drive.  Depression- Your emotional health is as important as your physical health.  If you're feeling down or losing interest in things you normally enjoy, please talk to your healthcare provider.  Seat Belts- can save your life; always wear one  Smoke/Carbon Monoxide detectors- These detectors need to be installed on the appropriate level of your home.  Replace batteries at least once a year.  Violence- If anyone is threatening or hurting you, please tell your healthcare provider.  Living Will/ Health care power of attorney- Discuss with  your healthcare provider and family.

## 2014-02-20 NOTE — Progress Notes (Signed)
Subjective:    Patient ID: Michele [name], female    DOB: 04-Jun-1956, 58 y.o.   MRN: [id number]  HPI  This 58 y.o. Cauc female is here for CPE; GYN exam performed 2014 by NP. Pt considers herself to be in good health and practices healthy living.   HCM: MMG- Current.           CRS- Current (normal).           IMM- Needs Tdap and Zostavax.  Patient Active Problem List   Diagnosis Date Noted  . Neck pain 03/01/2013  . Anxiety and depression 02/29/2012  . Hx of migraines 02/29/2012    Prior to Admission medications   Medication Sig Start Date End Date Taking? Authorizing Provider  cyclobenzaprine (FLEXERIL) 5 MG tablet TAKE 1 TABLET (5 MG TOTAL) BY MOUTH AS NEEDED. 05/03/13  Yes [name], NP  buPROPion [facility] SR) 150 MG 12 hr tablet Take 1 tablet (150 mg total) by mouth 2 (two) times daily. 03/01/13   [name], NP  Calcium Carbonate-Vitamin D (CALCIUM + D PO) Take by mouth.    Historical Provider, MD  HYDROcodone-ibuprofen (VICOPROFEN) 7.5-200 MG per tablet Take 0.5 tablets by mouth as needed. pain 03/01/13   [name], NP  zoster vaccine live, PF, (ZOSTAVAX) 42706 UNT/0.65ML injection Inject 19,400 Units into the skin once. 02/20/14   Barton Fanny, MD   PMHx, Surg Hx, Soc and Fam Hx reviewed.   Review of Systems  Constitutional: Negative.   HENT: Negative.   Eyes: Negative.   Respiratory: Negative.   Cardiovascular: Negative.   Gastrointestinal: Negative.   Endocrine: Negative.   Genitourinary: Negative.   Musculoskeletal: Negative.   Skin: Negative.   Allergic/Immunologic: Negative.   Neurological: Negative.   Hematological: Negative.   Psychiatric/Behavioral: Negative.       Objective:   Physical Exam  Nursing note and vitals reviewed. Constitutional: She is oriented to person, place, and time. Vital signs are normal. She appears well-developed and well-nourished. No distress.  HENT:  Head: Normocephalic and atraumatic.  Right Ear: Hearing,  tympanic membrane, external ear and ear canal normal.  Left Ear: Hearing, tympanic membrane, external ear and ear canal normal.  Nose: Nose normal. No nasal deformity or septal deviation.  Mouth/Throat: Uvula is midline, oropharynx is clear and moist and mucous membranes are normal. No oral lesions. Normal dentition. No dental caries.  Eyes: Conjunctivae, EOM and lids are normal. Pupils are equal, round, and reactive to light. No scleral icterus.  Neck: Trachea normal, normal range of motion and full passive range of motion without pain. Neck supple. No JVD present. No spinous process tenderness and no muscular tenderness present. No mass and no thyromegaly present.  Cardiovascular: Normal rate, regular rhythm, S1 normal, S2 normal, normal heart sounds and normal pulses.   No extrasystoles are present. PMI is not displaced.  Exam reveals no gallop and no friction rub.   No murmur heard. Pulmonary/Chest: Effort normal and breath sounds normal. No respiratory distress.  Abdominal: Soft. Normal appearance and bowel sounds are normal. She exhibits no distension, no abdominal bruit, no pulsatile midline mass and no mass. There is no hepatosplenomegaly. There is no tenderness. There is no guarding and no CVA tenderness.  Genitourinary:  Deferred- performed by GYN.  Musculoskeletal: Normal range of motion. She exhibits no edema and no tenderness.       Cervical back: Normal.       Thoracic back: Normal.  Lumbar back: Normal.  Lymphadenopathy:       Head (right side): No submental, no submandibular, no tonsillar, no preauricular, no posterior auricular and no occipital adenopathy present.       Head (left side): No submental, no submandibular, no tonsillar, no preauricular, no posterior auricular and no occipital adenopathy present.    She has no cervical adenopathy.    She has no axillary adenopathy.       Right: No inguinal and no supraclavicular adenopathy present.       Left: No inguinal and  no supraclavicular adenopathy present.  Neurological: She is alert and oriented to person, place, and time. She has normal strength and normal reflexes. She displays no atrophy and no tremor. No cranial nerve deficit or sensory deficit. She exhibits normal muscle tone. She displays a negative Romberg sign. Coordination and gait normal.  Skin: Skin is warm, dry and intact. No bruising, no lesion and no rash noted. She is not diaphoretic. No cyanosis or erythema. No pallor. Nails show no clubbing.  Psychiatric: She has a normal mood and affect. Her speech is normal and behavior is normal. Judgment and thought content normal. Cognition and memory are normal.  Mildly flat affect.       Assessment & Plan:  Routine general medical examination at a health care facility - Plan: POCT urinalysis dipstick, Hepatitis C antibody, Lipid panel, COMPLETE METABOLIC PANEL WITH GFR  Need for hepatitis C screening test - Plan: Hepatitis C antibody  Meds ordered this encounter  Medications  . zoster vaccine live, PF, (ZOSTAVAX) 96283 UNT/0.65ML injection    Sig: Inject 19,400 Units into the skin once.    Dispense:  1 each    Refill:  0

## 2014-02-22 ENCOUNTER — Encounter: Payer: Self-pay | Admitting: Family Medicine

## 2014-02-24 ENCOUNTER — Telehealth: Payer: Self-pay

## 2014-02-24 NOTE — Telephone Encounter (Signed)
Spoke with Imani at [facility]. Thyroid Panel with TSH added.

## 2014-02-24 NOTE — Telephone Encounter (Signed)
Please add "Thyroid Panel" lab.

## 2014-02-24 NOTE — Telephone Encounter (Signed)
Patient says that her lab results are on my chart but is missing some of the results [phone]

## 2014-02-24 NOTE — Telephone Encounter (Signed)
Pt is very upset that the TSH-Thyroid function was not added to her blood work. Can this be added on per patient?

## 2014-02-25 ENCOUNTER — Encounter: Payer: Self-pay | Admitting: Family Medicine

## 2014-02-25 LAB — THYROID PANEL WITH TSH
FREE THYROXINE INDEX: 3.5 (ref 1.0–3.9)
T3 UPTAKE: 34.9 % (ref 22.5–37.0)
T4 TOTAL: 10.1 ug/dL (ref 5.0–12.5)
TSH: 1.126 u[IU]/mL (ref 0.350–4.500)

## 2014-03-05 ENCOUNTER — Encounter: Payer: Self-pay | Admitting: Women's Health

## 2014-03-20 ENCOUNTER — Encounter: Payer: Self-pay | Admitting: Women's Health

## 2014-03-20 ENCOUNTER — Ambulatory Visit (INDEPENDENT_AMBULATORY_CARE_PROVIDER_SITE_OTHER): Payer: 59 | Admitting: Women's Health

## 2014-03-20 VITALS — BP 116/82 | Ht 62.5 in | Wt 145.8 lb

## 2014-03-20 DIAGNOSIS — F329 Major depressive disorder, single episode, unspecified: Secondary | ICD-10-CM

## 2014-03-20 DIAGNOSIS — F3289 Other specified depressive episodes: Secondary | ICD-10-CM

## 2014-03-20 DIAGNOSIS — F32A Depression, unspecified: Secondary | ICD-10-CM

## 2014-03-20 DIAGNOSIS — Z01419 Encounter for gynecological examination (general) (routine) without abnormal findings: Secondary | ICD-10-CM

## 2014-03-20 MED ORDER — BUPROPION HCL ER (SR) 150 MG PO TB12: 150.0000 mg | ORAL_TABLET | Freq: Two times a day (BID) | ORAL | Status: DC

## 2014-03-20 NOTE — Progress Notes (Signed)
Michele [name] 04/17/1956 [id number]    History:    Presents for annual exam.  Postmenopausal with no bleeding/no HRT. Stopped HRT early 2014. Normal Pap and mammogram history. On Wellbutrin 150 daily, had stopped but does feel better when takes it. Normal DEXA 2010 T score -1 AP spine. Negative colonoscopy 2013.  Tdap 2015. Labs at primary care lipid panel slightly elevated. Not sexually active, partner prostate cancer.  Past medical history, past surgical history, family history and social history were all reviewed and documented in the EPIC chart. 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 Michele [name] 20 doing well. Dog Molly died, struggling with her death. Helps at animal shelter.   ROS:  A  ROS was performed and pertinent positives and negatives are included.  Exam:  Filed Vitals:   03/20/14 1555  BP: 116/82    General appearance:  Normal Thyroid:  Symmetrical, normal in size, without palpable masses or nodularity. Respiratory  Auscultation:  Clear without wheezing or rhonchi Cardiovascular  Auscultation:  Regular rate, without rubs, murmurs or gallops  Edema/varicosities:  Not grossly evident Abdominal  Soft,nontender, without masses, guarding or rebound.  Liver/spleen:  No organomegaly noted  Hernia:  None appreciated  Skin  Inspection:  Grossly normal   Breasts: Examined lying and sitting.     Right: Without masses, retractions, discharge or axillary adenopathy.     Left: Without masses, retractions, discharge or axillary adenopathy. Gentitourinary   Inguinal/mons:  Normal without inguinal adenopathy  External genitalia:  Normal  BUS/Urethra/Skene's glands:  Normal  Vagina:  Normal  Cervix:  Normal  Uterus:   normal in size, shape and contour.  Midline and mobile  Adnexa/parametria:     Rt: Without masses or tenderness.   Lt: Without masses or tenderness.  Anus and perineum: Normal  Digital rectal exam: Normal sphincter tone without palpated masses or  tenderness  Assessment/Plan:  58 y.o. DWF G1P1 for annual exam  Anxiety/depression stable Wellbutrin 150 Normal GYN exam Labs at primary care  Plan: SBE's, continue annual mammogram, calcium rich diet, vitamin D 2000 daily encouraged. Repeat DEXA, instructed to schedule. Wellbutrin 150 daily prescription, proper use given and reviewed. Denies need for counseling at this time. Pap normal 2014, new screening guidelines reviewed.    [place], 4:43 PM 03/20/2014

## 2014-03-20 NOTE — Patient Instructions (Signed)
Health Recommendations for Postmenopausal Women Respected and ongoing research has looked at the most common causes of death, disability, and poor quality of life in postmenopausal women. The causes include heart disease, diseases of blood vessels, diabetes, depression, cancer, and bone loss (osteoporosis). Many things can be done to help lower the chances of developing these and other common problems: CARDIOVASCULAR DISEASE Heart Disease: A heart attack is a medical emergency. Know the signs and symptoms of a heart attack. Below are things women can do to reduce their risk for heart disease.   Do not smoke. If you smoke, quit.  Aim for a healthy weight. Being overweight causes many preventable deaths. Eat a healthy and balanced diet and drink an adequate amount of liquids.  Get moving. Make a commitment to be more physically active. Aim for 30 minutes of activity on most, if not all days of the week.  Eat for heart health. Choose a diet that is low in saturated fat and cholesterol and eliminate trans fat. Include whole grains, vegetables, and fruits. Read and understand the labels on food containers before buying.  Know your numbers. Ask your caregiver to check your blood pressure, cholesterol (total, HDL, LDL, triglycerides) and blood glucose. Work with your caregiver on improving your entire clinical picture.  High blood pressure. Limit or stop your table salt intake (try salt substitute and food seasonings). Avoid salty foods and drinks. Read labels on food containers before buying. Eating well and exercising can help control high blood pressure. STROKE  Stroke is a medical emergency. Stroke may be the result of a blood clot in a blood vessel in the brain or by a brain hemorrhage (bleeding). Know the signs and symptoms of a stroke. To lower the risk of developing a stroke:  Avoid fatty foods.  Quit smoking.  Control your diabetes, blood pressure, and irregular heart rate. THROMBOPHLEBITIS  (BLOOD CLOT) OF THE LEG  Becoming overweight and leading a stationary lifestyle may also contribute to developing blood clots. Controlling your diet and exercising will help lower the risk of developing blood clots. CANCER SCREENING  Breast Cancer: Take steps to reduce your risk of breast cancer.  You should practice "breast self-awareness." This means understanding the normal appearance and feel of your breasts and should include breast self-examination. Any changes detected, no matter how small, should be reported to your caregiver.  After age 40, you should have a clinical breast exam (CBE) every year.  Starting at age 40, you should consider having a mammogram (breast X-ray) every year.  If you have a family history of breast cancer, talk to your caregiver about genetic screening.  If you are at high risk for breast cancer, talk to your caregiver about having an MRI and a mammogram every year.  Intestinal or Stomach Cancer: Tests to consider are a rectal exam, fecal occult blood, sigmoidoscopy, and colonoscopy. Women who are high risk may need to be screened at an earlier age and more often.  Cervical Cancer:  Beginning at age 30, you should have a Pap test every 3 years as long as the past 3 Pap tests have been normal.  If you have had past treatment for cervical cancer or a condition that could lead to cancer, you need Pap tests and screening for cancer for at least 20 years after your treatment.  If you had a hysterectomy for a problem that was not cancer or a condition that could lead to cancer, then you no longer need Pap tests.    If you are between ages 65 and 70, and you have had normal Pap tests going back 10 years, you no longer need Pap tests.  If Pap tests have been discontinued, risk factors (such as a new sexual partner) need to be reassessed to determine if screening should be resumed.  Some medical problems can increase the chance of getting cervical cancer. In these  cases, your caregiver may recommend more frequent screening and Pap tests.  Uterine Cancer: If you have vaginal bleeding after reaching menopause, you should notify your caregiver.  Ovarian cancer: Other than yearly pelvic exams, there are no reliable tests available to screen for ovarian cancer at this time except for yearly pelvic exams.  Lung Cancer: Yearly chest X-rays can detect lung cancer and should be done on high risk women, such as cigarette smokers and women with chronic lung disease (emphysema).  Skin Cancer: A complete body skin exam should be done at your yearly examination. Avoid overexposure to the sun and ultraviolet light lamps. Use a strong sun block cream when in the sun. All of these things are important in lowering the risk of skin cancer. MENOPAUSE Menopause Symptoms: Hormone therapy products are effective for treating symptoms associated with menopause:  Moderate to severe hot flashes.  Night sweats.  Mood swings.  Headaches.  Tiredness.  Loss of sex drive.  Insomnia.  Other symptoms. Hormone replacement carries certain risks, especially in older women. Women who use or are thinking about using estrogen or estrogen with progestin treatments should discuss that with their caregiver. Your caregiver will help you understand the benefits and risks. The ideal dose of hormone replacement therapy is not known. The Food and Drug Administration (FDA) has concluded that hormone therapy should be used only at the lowest doses and for the shortest amount of time to reach treatment goals.  OSTEOPOROSIS Protecting Against Bone Loss and Preventing Fracture: If you use hormone therapy for prevention of bone loss (osteoporosis), the risks for bone loss must outweigh the risk of the therapy. Ask your caregiver about other medications known to be safe and effective for preventing bone loss and fractures. To guard against bone loss or fractures, the following is recommended:  If  you are less than age 50, take 1000 mg of calcium and at least 600 mg of Vitamin D per day.  If you are greater than age 50 but less than age 70, take 1200 mg of calcium and at least 600 mg of Vitamin D per day.  If you are greater than age 70, take 1200 mg of calcium and at least 800 mg of Vitamin D per day. Smoking and excessive alcohol intake increases the risk of osteoporosis. Eat foods rich in calcium and vitamin D and do weight bearing exercises several times a week as your caregiver suggests. DIABETES Diabetes Melitus: If you have Type I or Type 2 diabetes, you should keep your blood sugar under control with diet, exercise and recommended medication. Avoid too many sweets, starchy and fatty foods. Being overweight can make control more difficult. COGNITION AND MEMORY Cognition and Memory: Menopausal hormone therapy is not recommended for the prevention of cognitive disorders such as Alzheimer's disease or memory loss.  DEPRESSION  Depression may occur at any age, but is common in elderly women. The reasons may be because of physical, medical, social (loneliness), or financial problems and needs. If you are experiencing depression because of medical problems and control of symptoms, talk to your caregiver about this. Physical activity and   exercise may help with mood and sleep. Community and volunteer involvement may help your sense of value and worth. If you have depression and you feel that the problem is getting worse or becoming severe, talk to your caregiver about treatment options that are best for you. ACCIDENTS  Accidents are common and can be serious in the elderly woman. Prepare your house to prevent accidents. Eliminate throw rugs, place hand bars in the bath, shower and toilet areas. Avoid wearing high heeled shoes or walking on wet, snowy, and icy areas. Limit or stop driving if you have vision or hearing problems, or you feel you are unsteady with you movements and  reflexes. HEPATITIS C Hepatitis C is a type of viral infection affecting the liver. It is spread mainly through contact with blood from an infected person. It can be treated, but if left untreated, it can lead to severe liver damage over years. Many people who are infected do not know that the virus is in their blood. If you are a "baby-boomer", it is recommended that you have one screening test for Hepatitis C. IMMUNIZATIONS  Several immunizations are important to consider having during your senior years, including:   Tetanus, diptheria, and pertussis booster shot.  Influenza every year before the flu season begins.  Pneumonia vaccine.  Shingles vaccine.  Others as indicated based on your specific needs. Talk to your caregiver about these. Document Released: 01/27/2006 Document Revised: 11/21/2012 Document Reviewed: 09/22/2008 ExitCare Patient Information 2014 [employer].  

## 2014-04-14 ENCOUNTER — Other Ambulatory Visit: Payer: Self-pay | Admitting: Gynecology

## 2014-04-14 DIAGNOSIS — Z13820 Encounter for screening for osteoporosis: Secondary | ICD-10-CM

## 2014-05-19 DIAGNOSIS — M8580 Other specified disorders of bone density and structure, unspecified site: Secondary | ICD-10-CM

## 2014-05-19 HISTORY — DX: Other specified disorders of bone density and structure, unspecified site: M85.80

## 2014-06-10 ENCOUNTER — Telehealth: Payer: Self-pay | Admitting: Gynecology

## 2014-06-10 ENCOUNTER — Encounter: Payer: Self-pay | Admitting: Gynecology

## 2014-06-10 ENCOUNTER — Ambulatory Visit (INDEPENDENT_AMBULATORY_CARE_PROVIDER_SITE_OTHER): Payer: 59

## 2014-06-10 ENCOUNTER — Other Ambulatory Visit: Payer: Self-pay | Admitting: Gynecology

## 2014-06-10 DIAGNOSIS — M8580 Other specified disorders of bone density and structure, unspecified site: Secondary | ICD-10-CM

## 2014-06-10 DIAGNOSIS — M898X9 Other specified disorders of bone, unspecified site: Secondary | ICD-10-CM

## 2014-06-10 DIAGNOSIS — M899 Disorder of bone, unspecified: Secondary | ICD-10-CM

## 2014-06-10 DIAGNOSIS — Z13820 Encounter for screening for osteoporosis: Secondary | ICD-10-CM

## 2014-06-10 DIAGNOSIS — M949 Disorder of cartilage, unspecified: Secondary | ICD-10-CM

## 2014-06-10 NOTE — Telephone Encounter (Signed)
Tell patient bone density shows a significant decline in her bone density. We may want to consider treatment. Recommend vitamin D level, PTH and office visit to discuss treatment options.

## 2014-06-10 NOTE — Telephone Encounter (Signed)
Pt will be in office tomorrow to have labs done and call back to schedule appointment.

## 2014-06-11 ENCOUNTER — Other Ambulatory Visit: Payer: 59

## 2014-06-11 DIAGNOSIS — M898X9 Other specified disorders of bone, unspecified site: Secondary | ICD-10-CM

## 2014-06-11 DIAGNOSIS — M8580 Other specified disorders of bone density and structure, unspecified site: Secondary | ICD-10-CM

## 2014-06-12 LAB — VITAMIN D 25 HYDROXY (VIT D DEFICIENCY, FRACTURES): Vit D, 25-Hydroxy: 30 ng/mL (ref 30–89)

## 2014-06-13 LAB — PTH, INTACT AND CALCIUM
Calcium: 9.6 mg/dL (ref 8.4–10.5)
PTH: 53 pg/mL (ref 14.0–72.0)

## 2014-06-23 ENCOUNTER — Other Ambulatory Visit: Payer: Self-pay

## 2014-06-23 DIAGNOSIS — F329 Major depressive disorder, single episode, unspecified: Secondary | ICD-10-CM

## 2014-06-23 DIAGNOSIS — F32A Depression, unspecified: Secondary | ICD-10-CM

## 2014-06-23 MED ORDER — BUPROPION HCL ER (SR) 150 MG PO TB12: 150.0000 mg | ORAL_TABLET | Freq: Two times a day (BID) | ORAL | Status: DC

## 2014-06-24 ENCOUNTER — Ambulatory Visit (INDEPENDENT_AMBULATORY_CARE_PROVIDER_SITE_OTHER): Payer: 59 | Admitting: Gynecology

## 2014-06-24 ENCOUNTER — Encounter: Payer: Self-pay | Admitting: Gynecology

## 2014-06-24 DIAGNOSIS — M8580 Other specified disorders of bone density and structure, unspecified site: Secondary | ICD-10-CM

## 2014-06-24 DIAGNOSIS — M949 Disorder of cartilage, unspecified: Secondary | ICD-10-CM

## 2014-06-24 DIAGNOSIS — M899 Disorder of bone, unspecified: Secondary | ICD-10-CM

## 2014-06-24 MED ORDER — PROGESTERONE MICRONIZED 100 MG PO CAPS: 100.0000 mg | ORAL_CAPSULE | Freq: Every day | ORAL | Status: DC

## 2014-06-24 MED ORDER — ESTRADIOL 0.05 MG/24HR TD PTTW: 1.0000 | MEDICATED_PATCH | TRANSDERMAL | Status: DC

## 2014-06-24 NOTE — Progress Notes (Signed)
Michele [name] 09/01/1956 [id number]        58 y.o.  G2P0011 patient of Nancy's presents to discuss her most recent bone density. DEXA 05/2014 with T score -2.0, 11% decline AP spine, 6% decline left hip, 12% decline right hip. FRAX 6.9%/0.4%. Vitamin D level 30, normal TSH, PTH and calcium.  Past medical history,surgical history, problem list, medications, allergies, family history and social history were all reviewed and documented in the EPIC chart.  Directed ROS with pertinent positives and negatives documented in the history of present illness/assessment and plan.  Assessment/Plan:  58 y.o. G2P0011 with T score -2.0 in statistically significant decline from prior DEXA. FRAX does not indicate need to treat the given degree of loss of bone the issue is whether treatment would be warranted at her young age. Vitamin D level is marginal. In discussion of her overall health she is having some significant menopausal symptoms of generalized joint discomfort, fuzzy thinking, loss of feeling of well-being and some mild hot flushes and sweats. Had transiently been on HRT previously but discontinued. Reviewed osteopenia/osteoporosis medications include bisphosphonates. Risks/side effects to include esophageal, GERD, osteonecrosis of the jaw, atypical fractures particularly with prolonged use reviewed. I reviewed HRT with her to include the WHI study with increased risk of stroke heart attack DVT and breast cancer as well as ACOG in [facility] statements for lowest dose shortest period of time. The issues of transdermal benefits also discussed. After lengthy discussion of all the above we agree to initiate HRT of Minivelle 0.05 mg patch twice weekly and Prometrium 100 mg nightly for her menopausal symptoms which also will benefit her bones as well as maximizing her vitamin D at 2000 units OTC daily and recheck her vitamin D level in 2 months. Plan repeat DEXA in 2 years. She understands risks of continued loss and is  comfortable with this. She also will call me in one to 2 months let me now how she is doing and if there is any need for dosage adjustment as far as her HRT. Patient also knows to call me if she does any vaginal bleeding.   Note: This document was prepared with digital dictation and possible smart phrase technology. Any transcriptional errors that result from this process are unintentional.   Anastasio Auerbach MD, 5:10 PM 06/24/2014

## 2014-06-24 NOTE — Patient Instructions (Signed)
Start on the estrogen patch twice weekly and the progesterone pill nightly. Report any issues with this. Report any vaginal bleeding. Call me in a month or 2 to how you're doing regardless. Start on vitamin D 2000 units over-the-counter. Return in 2 months to have your vitamin D level rechecked.

## 2014-09-19 ENCOUNTER — Encounter: Payer: Self-pay | Admitting: Gastroenterology

## 2014-10-20 ENCOUNTER — Encounter: Payer: Self-pay | Admitting: Gynecology

## 2014-10-27 ENCOUNTER — Other Ambulatory Visit: Payer: Self-pay | Admitting: Gynecology

## 2014-10-27 ENCOUNTER — Ambulatory Visit (INDEPENDENT_AMBULATORY_CARE_PROVIDER_SITE_OTHER): Payer: 59

## 2014-10-27 ENCOUNTER — Encounter: Payer: Self-pay | Admitting: Gynecology

## 2014-10-27 ENCOUNTER — Ambulatory Visit (INDEPENDENT_AMBULATORY_CARE_PROVIDER_SITE_OTHER): Payer: 59 | Admitting: Gynecology

## 2014-10-27 VITALS — BP 122/78

## 2014-10-27 DIAGNOSIS — R102 Pelvic and perineal pain: Secondary | ICD-10-CM

## 2014-10-27 DIAGNOSIS — N83319 Acquired atrophy of ovary, unspecified side: Secondary | ICD-10-CM

## 2014-10-27 DIAGNOSIS — R1900 Intra-abdominal and pelvic swelling, mass and lump, unspecified site: Secondary | ICD-10-CM | POA: Insufficient documentation

## 2014-10-27 DIAGNOSIS — N8331 Acquired atrophy of ovary: Secondary | ICD-10-CM

## 2014-10-27 NOTE — Patient Instructions (Signed)
Ovarian Cyst An ovarian cyst is a fluid-filled sac that forms on an ovary. The ovaries are small organs that produce eggs in women. Various types of cysts can form on the ovaries. Most are not cancerous. Many do not cause problems, and they often go away on their own. Some may cause symptoms and require treatment. Common types of ovarian cysts include:  Functional cysts--These cysts may occur every month during the menstrual cycle. This is normal. The cysts usually go away with the next menstrual cycle if the woman does not get pregnant. Usually, there are no symptoms with a functional cyst.  Endometrioma cysts--These cysts form from the tissue that lines the uterus. They are also called "chocolate cysts" because they become filled with blood that turns brown. This type of cyst can cause pain in the lower abdomen during intercourse and with your menstrual period.  Cystadenoma cysts--This type develops from the cells on the outside of the ovary. These cysts can get very big and cause lower abdomen pain and pain with intercourse. This type of cyst can twist on itself, cut off its blood supply, and cause severe pain. It can also easily rupture and cause a lot of pain.  Dermoid cysts--This type of cyst is sometimes found in both ovaries. These cysts may contain different kinds of body tissue, such as skin, teeth, hair, or cartilage. They usually do not cause symptoms unless they get very big.  Theca lutein cysts--These cysts occur when too much of a certain hormone (human chorionic gonadotropin) is produced and overstimulates the ovaries to produce an egg. This is most common after procedures used to assist with the conception of a baby (in vitro fertilization). CAUSES   Fertility drugs can cause a condition in which multiple large cysts are formed on the ovaries. This is called ovarian hyperstimulation syndrome.  A condition called polycystic ovary syndrome can cause hormonal imbalances that can lead to  nonfunctional ovarian cysts. SIGNS AND SYMPTOMS  Many ovarian cysts do not cause symptoms. If symptoms are present, they may include:  Pelvic pain or pressure.  Pain in the lower abdomen.  Pain during sexual intercourse.  Increasing girth (swelling) of the abdomen.  Abnormal menstrual periods.  Increasing pain with menstrual periods.  Stopping having menstrual periods without being pregnant. DIAGNOSIS  These cysts are commonly found during a routine or annual pelvic exam. Tests may be ordered to find out more about the cyst. These tests may include:  Ultrasound.  X-ray of the pelvis.  CT scan.  MRI.  Blood tests. TREATMENT  Many ovarian cysts go away on their own without treatment. Your health care provider may want to check your cyst regularly for 2-3 months to see if it changes. For women in menopause, it is particularly important to monitor a cyst closely because of the higher rate of ovarian cancer in menopausal women. When treatment is needed, it may include any of the following:  A procedure to drain the cyst (aspiration). This may be done using a long needle and ultrasound. It can also be done through a laparoscopic procedure. This involves using a thin, lighted tube with a tiny camera on the end (laparoscope) inserted through a small incision.  Surgery to remove the whole cyst. This may be done using laparoscopic surgery or an open surgery involving a larger incision in the lower abdomen.  Hormone treatment or birth control pills. These methods are sometimes used to help dissolve a cyst. HOME CARE INSTRUCTIONS   Only take over-the-counter   or prescription medicines as directed by your health care provider.  Follow up with your health care provider as directed.  Get regular pelvic exams and Pap tests. SEEK MEDICAL CARE IF:   Your periods are late, irregular, or painful, or they stop.  Your pelvic pain or abdominal pain does not go away.  Your abdomen becomes  larger or swollen.  You have pressure on your bladder or trouble emptying your bladder completely.  You have pain during sexual intercourse.  You have feelings of fullness, pressure, or discomfort in your stomach.  You lose weight for no apparent reason.  You feel generally ill.  You become constipated.  You lose your appetite.  You develop acne.  You have an increase in body and facial hair.  You are gaining weight, without changing your exercise and eating habits.  You think you are pregnant. SEEK IMMEDIATE MEDICAL CARE IF:   You have increasing abdominal pain.  You feel sick to your stomach (nauseous), and you throw up (vomit).  You develop a fever that comes on suddenly.  You have abdominal pain during a bowel movement.  Your menstrual periods become heavier than usual. MAKE SURE YOU:  Understand these instructions.  Will watch your condition.  Will get help right away if you are not doing well or get worse. Document Released: 12/05/2005 Document Revised: 12/10/2013 Document Reviewed: 08/12/2013 ExitCare Patient Information 2015 [employer]. This information is not intended to replace advice given to you by your health care provider. Make sure you discuss any questions you have with your health care provider.  

## 2014-10-27 NOTE — Progress Notes (Signed)
  Patient is a 58 year old postmenopausal patient who presented to the office today that for over a year she's been complaining of right back pain radiating to her side and right lower leg. She recently saw the orthopedic surgeon Dr.Bean who had done an MRI and there was a questionable ovarian cysts/mass noted at the time of the scan. Since they did BMI in his office we do not have the report but we will try to obtain it. Patient had been started on HRT in 2014 but used it for less than a month because she did not like the way she was feeling. She reports no vaginal bleeding. Not sexually active. The only surgery she's had in the past been cesarean section and an ectopic pregnancy which she does not recall which side  Physical Exam: Back: No CVA tenderness Abdomen: Soft nontender no rebound or guarding Pelvic: Bartholin urethra Skene was within normal limits Vagina: Some atrophic changes no lesions seen Cervix: No lesions or discharge Uterus: Anteverted normal size shape and consistency Right adnexa and questionable tenderness difficult to evaluate possible mass to the fact the patient is not sexually active and very narrow vagina. Left adnexa: Same Rectal exam: Not done  Ultrasound today: Uterus measures 6.3 x 5.3 x 3.8 cm endometrial stripe 1.4 mm. Right adnexal pelvic mass cystic measuring 7.7 x 5.2 x 6.4 cm average size 6.4 cm. Cyst is primarily echo-free although a solid mural nodule was noted the wall of the mass measured 5 mm with positive color blood flow to this area. Unable to perform Doppler arterial blood flow venous only, left ovary atrophic normal echo. No fluid in the cul-de-sac.  Assessment/plan: Patient with chronic low back discomfort radiating to her right side and right leg with incidental finding on MRI recently of her right ovarian cyst. Ultrasound done in our office today demonstrated a 6.4 echo-free cyst with a solid mural nodule which warrants further evaluation. She has  informed me that orthopedic surgeons said that she had a cyst on her spine? He had attributed her discomfort to rheumatoid arthritis and possible degenerative joint disease.A ROMA-1 Ovarian cancer screening blood tests will be drawn today. I would like to refer her to the GYN oncologist for a second opinion in reference to intervention regardless of results of ROMA-1 in the event that they may want to proceed with bilateral laparoscopic BSO and frozen section at the time for possible staging while under anesthesia. I explained this to [name] today and we will make the appointment for her and we will make this note accessible to them as well and lab result. Patient with no family history of any GYN malignancy only grandmother with colon cancer. Patient on no hormone replacement therapy.

## 2014-10-28 ENCOUNTER — Telehealth: Payer: Self-pay | Admitting: *Deleted

## 2014-10-28 NOTE — Telephone Encounter (Signed)
-----   Message from Terrance Mass, MD sent at 10/27/2014 10:39 AM EST ----- Anderson Malta, please make appointment for this patient with the GYN oncologist/ [facility]. Patient with highly suspicious adnexal mass. Please make appointment for next week blood test pending this week.

## 2014-10-28 NOTE — Telephone Encounter (Signed)
I called GYN oncologist office and was told that the MD will need to review notes before appointment can be made.

## 2014-10-28 NOTE — Telephone Encounter (Signed)
Appointment on 11/03/14 @ 11:45am with Dr.Brewster pt informed pelvic exam will be done this day as well.

## 2014-11-01 LAB — OVARIAN MALIGNANCY RISK-ROMA
CA125: 12 U/mL (ref <35)
HE4: 43 pM (ref <151)
ROMA Postmenopausal: 0.86 (ref <2.77)
ROMA Premenopausal: 0.53 (ref <1.31)

## 2014-11-03 ENCOUNTER — Encounter: Payer: Self-pay | Admitting: Gynecologic Oncology

## 2014-11-03 ENCOUNTER — Ambulatory Visit: Payer: 59 | Attending: Gynecologic Oncology | Admitting: Gynecologic Oncology

## 2014-11-03 VITALS — BP 131/79 | HR 71 | Resp 20 | Ht 63.07 in | Wt 146.7 lb

## 2014-11-03 DIAGNOSIS — Z87891 Personal history of nicotine dependence: Secondary | ICD-10-CM | POA: Insufficient documentation

## 2014-11-03 DIAGNOSIS — N809 Endometriosis, unspecified: Secondary | ICD-10-CM | POA: Diagnosis not present

## 2014-11-03 DIAGNOSIS — Z79899 Other long term (current) drug therapy: Secondary | ICD-10-CM | POA: Insufficient documentation

## 2014-11-03 DIAGNOSIS — K219 Gastro-esophageal reflux disease without esophagitis: Secondary | ICD-10-CM | POA: Insufficient documentation

## 2014-11-03 DIAGNOSIS — N8320 Unspecified ovarian cysts: Secondary | ICD-10-CM | POA: Diagnosis not present

## 2014-11-03 DIAGNOSIS — M8580 Other specified disorders of bone density and structure, unspecified site: Secondary | ICD-10-CM | POA: Insufficient documentation

## 2014-11-03 DIAGNOSIS — N83201 Unspecified ovarian cyst, right side: Secondary | ICD-10-CM

## 2014-11-03 NOTE — Patient Instructions (Signed)
Plan for surgery at [facility] next week with Dr. Skeet Latch.  We will contact you with the date and time for your pre-op appointment and your surgery.  Please call for any questions or concerns.
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
## 2014-11-11 ENCOUNTER — Ambulatory Visit: Payer: 59 | Admitting: Gastroenterology

## 2014-12-26 ENCOUNTER — Telehealth: Payer: Self-pay | Admitting: *Deleted

## 2014-12-26 NOTE — Telephone Encounter (Signed)
Pt called to check status of follow up appointment with Dr. Skeet Latch. Pt was given 01/01/2015 @ 11:00. Pt is unable to take this appointment because it will be conflicting with another appointment. Pt was given the option to see Dr. Denman George ,pt stated she would prefer an appointment with Dr. Skeet Latch, because Dr. Skeet Latch was her surgeon at [facility]. Pt was notified that Dr. Skeet Latch will not be in the [place] office again until 01/29/15. Pt stated she will call back at a later date to schedule an appointment.

## 2015-03-11 ENCOUNTER — Encounter: Payer: Self-pay | Admitting: Women's Health

## 2015-03-12 ENCOUNTER — Encounter: Payer: Self-pay | Admitting: Women's Health

## 2015-09-21 ENCOUNTER — Encounter: Payer: Self-pay | Admitting: Internal Medicine

## 2016-01-27 ENCOUNTER — Encounter: Payer: Self-pay | Admitting: Women's Health

## 2016-01-28 ENCOUNTER — Other Ambulatory Visit (HOSPITAL_COMMUNITY)
Admission: RE | Admit: 2016-01-28 | Discharge: 2016-01-28 | Disposition: A | Discharge status: Home / Self Care | Payer: 59 | Source: Ambulatory Visit | Attending: Women's Health | Admitting: Women's Health

## 2016-01-28 ENCOUNTER — Encounter: Payer: Self-pay | Admitting: Women's Health

## 2016-01-28 ENCOUNTER — Ambulatory Visit (INDEPENDENT_AMBULATORY_CARE_PROVIDER_SITE_OTHER): Payer: 59 | Admitting: Women's Health

## 2016-01-28 VITALS — BP 120/74 | Ht 63.0 in | Wt 134.0 lb

## 2016-01-28 DIAGNOSIS — Z01419 Encounter for gynecological examination (general) (routine) without abnormal findings: Secondary | ICD-10-CM | POA: Insufficient documentation

## 2016-01-28 DIAGNOSIS — M542 Cervicalgia: Secondary | ICD-10-CM | POA: Diagnosis not present

## 2016-01-28 DIAGNOSIS — M8580 Other specified disorders of bone density and structure, unspecified site: Secondary | ICD-10-CM

## 2016-01-28 DIAGNOSIS — M899 Disorder of bone, unspecified: Secondary | ICD-10-CM | POA: Diagnosis not present

## 2016-01-28 DIAGNOSIS — F329 Major depressive disorder, single episode, unspecified: Secondary | ICD-10-CM | POA: Diagnosis not present

## 2016-01-28 DIAGNOSIS — Z1151 Encounter for screening for human papillomavirus (HPV): Secondary | ICD-10-CM | POA: Insufficient documentation

## 2016-01-28 DIAGNOSIS — Z13820 Encounter for screening for osteoporosis: Secondary | ICD-10-CM | POA: Diagnosis not present

## 2016-01-28 DIAGNOSIS — F32A Depression, unspecified: Secondary | ICD-10-CM

## 2016-01-28 MED ORDER — HYDROCODONE-IBUPROFEN 7.5-200 MG PO TABS: 0.5000 | ORAL_TABLET | ORAL | Status: DC | PRN

## 2016-01-28 MED ORDER — CYCLOBENZAPRINE HCL 5 MG PO TABS: ORAL_TABLET | ORAL | Status: DC

## 2016-01-28 MED ORDER — BUPROPION HCL ER (SR) 150 MG PO TB12: 150.0000 mg | ORAL_TABLET | Freq: Two times a day (BID) | ORAL | Status: DC

## 2016-01-28 NOTE — Patient Instructions (Signed)

## 2016-01-28 NOTE — Progress Notes (Signed)
Michele [name] March 21, 1956 [id number]    History:    Presents for annual exam.  Postmenopausal with no bleeding, discharge or HRT. 2016  5 cm left ovarian cyst and ovary removed, bilateral salpingectomy benign, surgery done in [facility]. Not sexually active. Normal PAP  and mammogram history.  Migraines/Vicoprofen. Depression/Anxiety stable on Wellbutrin. 2015 DEXA T score -2 at spine, FRAX 6.9%/0.4%.  Past medical history, past surgical history, family history and social history were all reviewed and documented in the EPIC chart. Engaged for 10 years, no plans to marry.  Works as an Optometrist for [employer]. One son 25 yo, doing well, lives out of town. Shingles and first Pneumonia vaccines completed. Tdap 2015. Colonoscopy 2013.   ROS:  A ROS was performed and pertinent positives and negatives are included.  Exam:  Filed Vitals:   01/28/16 1417  BP: 120/74    General appearance:  Normal Thyroid:  Symmetrical, normal in size, without palpable masses or nodularity. Respiratory  Auscultation:  Clear without wheezing or rhonchi Cardiovascular  Auscultation:  Regular rate, without rubs, murmurs or gallops  Edema/varicosities:  Not grossly evident Abdominal  Soft,nontender, without masses, guarding or rebound.  Liver/spleen:  No organomegaly noted  Hernia:  None appreciated  Skin  Inspection:  Grossly normal   Breasts: Examined lying and sitting.     Right: Without masses, retractions, discharge or axillary adenopathy.     Left: Without masses, retractions, discharge or axillary adenopathy. Gentitourinary   Inguinal/mons:  Normal without inguinal adenopathy  External genitalia:  Normal  BUS/Urethra/Skene's glands:  Normal  Vagina:  Normal  Cervix:  Normal  Uterus:  normal in size, shape and contour.  Midline and mobile  Adnexa/parametria:     Rt: Without masses or tenderness.   Lt: absent  Anus and perineum: Normal  Digital rectal exam: Normal sphincter tone without  palpated masses or tenderness  Assessment/Plan:  60 y.o. DWF G2 P1    for annual exam.  No concerns.  Postmenopausal/no bleeding/no HRT/not sexually active Osteopenia without elevated FRAX Depression/Anxiety stable on Wellbutrin 2016 bilateral salpingectomy with left oophorectomy /benign ovarian mass -done in [place] care  Plan:  Wellbutrin 150mg  po qd. prescription, proper use given and reviewed importance of regular exercise, leisure activities.  Hydrocodone/Ibuprofen 7.5mg /200mg  po prn migraines, uses rarely reports one prescription every 6 months. Flexeril 5mg  po prn. uses rarely for neck problems aware to use sparingly.  Instructed to schedule Mammogram and DEXA scans for this year.  SBEs, heart healthy diet, calcium rich foods, increase exercise daily 30 minutes.  Safety, fall prevention and vitamin D 1000 daily encouraged. Instructed to have vitamin D level checked at primary care. Pap with HR HPV typing, new screening guidelines reviewed.     [name] [facility], 3:25 PM 01/28/2016

## 2016-01-29 LAB — URINALYSIS W MICROSCOPIC + REFLEX CULTURE
Bacteria, UA: NONE SEEN [HPF]
Bilirubin Urine: NEGATIVE
Casts: NONE SEEN [LPF]
Glucose, UA: NEGATIVE
Hgb urine dipstick: NEGATIVE
KETONES UR: NEGATIVE
Leukocytes, UA: NEGATIVE
NITRITE: NEGATIVE
PH: 5.5 (ref 5.0–8.0)
Protein, ur: NEGATIVE
RBC / HPF: NONE SEEN RBC/HPF (ref <=2)
SQUAMOUS EPITHELIAL / LPF: NONE SEEN [HPF] (ref <=5)
Specific Gravity, Urine: 1.033 (ref 1.001–1.035)
WBC, UA: NONE SEEN WBC/HPF (ref <=5)
Yeast: NONE SEEN [HPF]

## 2016-02-02 LAB — CYTOLOGY - PAP

## 2016-04-05 ENCOUNTER — Encounter: Payer: Self-pay | Admitting: Women's Health

## 2016-04-07 ENCOUNTER — Other Ambulatory Visit: Payer: Self-pay

## 2016-04-07 DIAGNOSIS — F329 Major depressive disorder, single episode, unspecified: Secondary | ICD-10-CM

## 2016-04-07 DIAGNOSIS — F32A Depression, unspecified: Secondary | ICD-10-CM

## 2016-04-07 MED ORDER — BUPROPION HCL ER (SR) 150 MG PO TB12: 150.0000 mg | ORAL_TABLET | Freq: Two times a day (BID) | ORAL | Status: DC

## 2016-04-12 ENCOUNTER — Other Ambulatory Visit: Payer: Self-pay

## 2016-04-12 DIAGNOSIS — F32A Depression, unspecified: Secondary | ICD-10-CM

## 2016-04-12 DIAGNOSIS — F329 Major depressive disorder, single episode, unspecified: Secondary | ICD-10-CM

## 2016-04-12 MED ORDER — BUPROPION HCL ER (SR) 150 MG PO TB12: 150.0000 mg | ORAL_TABLET | Freq: Two times a day (BID) | ORAL | Status: DC

## 2016-07-01 DIAGNOSIS — E782 Mixed hyperlipidemia: Secondary | ICD-10-CM | POA: Diagnosis not present

## 2016-07-01 DIAGNOSIS — F419 Anxiety disorder, unspecified: Secondary | ICD-10-CM | POA: Diagnosis not present

## 2016-08-29 ENCOUNTER — Ambulatory Visit (INDEPENDENT_AMBULATORY_CARE_PROVIDER_SITE_OTHER): Payer: BLUE CROSS/BLUE SHIELD

## 2016-08-29 ENCOUNTER — Other Ambulatory Visit: Payer: Self-pay | Admitting: Gynecology

## 2016-08-29 DIAGNOSIS — Z13820 Encounter for screening for osteoporosis: Secondary | ICD-10-CM | POA: Diagnosis not present

## 2016-08-29 DIAGNOSIS — M899 Disorder of bone, unspecified: Secondary | ICD-10-CM | POA: Diagnosis not present

## 2016-08-29 DIAGNOSIS — M8580 Other specified disorders of bone density and structure, unspecified site: Secondary | ICD-10-CM

## 2016-08-30 ENCOUNTER — Encounter: Payer: Self-pay | Admitting: Gynecology

## 2016-08-31 ENCOUNTER — Other Ambulatory Visit: Payer: Self-pay | Admitting: *Deleted

## 2016-08-31 ENCOUNTER — Telehealth: Payer: Self-pay | Admitting: *Deleted

## 2016-08-31 DIAGNOSIS — M8580 Other specified disorders of bone density and structure, unspecified site: Secondary | ICD-10-CM

## 2016-08-31 DIAGNOSIS — M542 Cervicalgia: Secondary | ICD-10-CM

## 2016-08-31 MED ORDER — HYDROCODONE-IBUPROFEN 7.5-200 MG PO TABS: 0.5000 | ORAL_TABLET | ORAL | 0 refills | Status: DC | PRN

## 2016-08-31 NOTE — Telephone Encounter (Signed)
Okay for Vicoprofen 7.5/200 mg 1 every 6 hours when necessary #30 no refills

## 2016-08-31 NOTE — Telephone Encounter (Signed)
Pt informed with the note, pt will come pick up.

## 2016-08-31 NOTE — Telephone Encounter (Signed)
Pt called requesting refill on Hydrocondone-ibuprofen for migraines, has one 1 pill left of Rx. Please advise

## 2016-09-28 DIAGNOSIS — E782 Mixed hyperlipidemia: Secondary | ICD-10-CM | POA: Diagnosis not present

## 2016-09-28 DIAGNOSIS — R5383 Other fatigue: Secondary | ICD-10-CM | POA: Diagnosis not present

## 2016-09-28 DIAGNOSIS — E559 Vitamin D deficiency, unspecified: Secondary | ICD-10-CM | POA: Diagnosis not present

## 2016-09-28 DIAGNOSIS — Z79899 Other long term (current) drug therapy: Secondary | ICD-10-CM | POA: Diagnosis not present

## 2016-09-28 DIAGNOSIS — R300 Dysuria: Secondary | ICD-10-CM | POA: Diagnosis not present

## 2016-11-04 ENCOUNTER — Telehealth: Payer: Self-pay | Admitting: *Deleted

## 2016-11-04 MED ORDER — BUPROPION HCL ER (SR) 150 MG PO TB12: 150.0000 mg | ORAL_TABLET | Freq: Two times a day (BID) | ORAL | 0 refills | Status: DC

## 2016-11-04 NOTE — Telephone Encounter (Signed)
Patient has new pharmacy, no longer using mail order. Would like Wellbutrin 150 mg Rx sent to [facility] on [address]. Rx sent.

## 2017-02-23 ENCOUNTER — Other Ambulatory Visit: Payer: Self-pay | Admitting: Women's Health

## 2017-02-24 DIAGNOSIS — M7741 Metatarsalgia, right foot: Secondary | ICD-10-CM | POA: Diagnosis not present

## 2017-02-24 DIAGNOSIS — M7742 Metatarsalgia, left foot: Secondary | ICD-10-CM | POA: Diagnosis not present

## 2017-02-24 DIAGNOSIS — M21612 Bunion of left foot: Secondary | ICD-10-CM | POA: Diagnosis not present

## 2017-02-24 DIAGNOSIS — M21611 Bunion of right foot: Secondary | ICD-10-CM | POA: Diagnosis not present

## 2017-03-02 DIAGNOSIS — R635 Abnormal weight gain: Secondary | ICD-10-CM | POA: Diagnosis not present

## 2017-03-02 DIAGNOSIS — Z79899 Other long term (current) drug therapy: Secondary | ICD-10-CM | POA: Diagnosis not present

## 2017-03-02 DIAGNOSIS — R5383 Other fatigue: Secondary | ICD-10-CM | POA: Diagnosis not present

## 2017-03-02 DIAGNOSIS — E789 Disorder of lipoprotein metabolism, unspecified: Secondary | ICD-10-CM | POA: Diagnosis not present

## 2017-03-10 ENCOUNTER — Encounter: Payer: Self-pay | Admitting: Women's Health

## 2017-03-10 ENCOUNTER — Ambulatory Visit (INDEPENDENT_AMBULATORY_CARE_PROVIDER_SITE_OTHER): Payer: BLUE CROSS/BLUE SHIELD | Admitting: Women's Health

## 2017-03-10 VITALS — BP 140/90 | Ht 62.5 in | Wt 147.0 lb

## 2017-03-10 DIAGNOSIS — F3289 Other specified depressive episodes: Secondary | ICD-10-CM | POA: Diagnosis not present

## 2017-03-10 DIAGNOSIS — Z01419 Encounter for gynecological examination (general) (routine) without abnormal findings: Secondary | ICD-10-CM | POA: Diagnosis not present

## 2017-03-10 DIAGNOSIS — M542 Cervicalgia: Secondary | ICD-10-CM | POA: Diagnosis not present

## 2017-03-10 MED ORDER — HYDROCODONE-IBUPROFEN 7.5-200 MG PO TABS: 0.5000 | ORAL_TABLET | ORAL | 0 refills | Status: DC | PRN

## 2017-03-10 MED ORDER — BUPROPION HCL ER (SR) 150 MG PO TB12: 150.0000 mg | ORAL_TABLET | Freq: Two times a day (BID) | ORAL | 4 refills | Status: DC

## 2017-03-10 NOTE — Patient Instructions (Signed)
Health Maintenance for Postmenopausal Women Menopause is a normal process in which your reproductive ability comes to an end. This process happens gradually over a span of months to years, usually between the ages of 33 and 38. Menopause is complete when you have missed 12 consecutive menstrual periods. It is important to talk with your health care provider about some of the most common conditions that affect postmenopausal women, such as heart disease, cancer, and bone loss (osteoporosis). Adopting a healthy lifestyle and getting preventive care can help to promote your health and wellness. Those actions can also lower your chances of developing some of these common conditions. What should I know about menopause? During menopause, you may experience a number of symptoms, such as:  Moderate-to-severe hot flashes.  Night sweats.  Decrease in sex drive.  Mood swings.  Headaches.  Tiredness.  Irritability.  Memory problems.  Insomnia. Choosing to treat or not to treat menopausal changes is an individual decision that you make with your health care provider. What should I know about hormone replacement therapy and supplements? Hormone therapy products are effective for treating symptoms that are associated with menopause, such as hot flashes and night sweats. Hormone replacement carries certain risks, especially as you become older. If you are thinking about using estrogen or estrogen with progestin treatments, discuss the benefits and risks with your health care provider. What should I know about heart disease and stroke? Heart disease, heart attack, and stroke become more likely as you age. This may be due, in part, to the hormonal changes that your body experiences during menopause. These can affect how your body processes dietary fats, triglycerides, and cholesterol. Heart attack and stroke are both medical emergencies. There are many things that you can do to help prevent heart disease  and stroke:  Have your blood pressure checked at least every 1-2 years. High blood pressure causes heart disease and increases the risk of stroke.  If you are 48-61 years old, ask your health care provider if you should take aspirin to prevent a heart attack or a stroke.  Do not use any tobacco products, including cigarettes, chewing tobacco, or electronic cigarettes. If you need help quitting, ask your health care provider.  It is important to eat a healthy diet and maintain a healthy weight.  Be sure to include plenty of vegetables, fruits, low-fat dairy products, and lean protein.  Avoid eating foods that are high in solid fats, added sugars, or salt (sodium).  Get regular exercise. This is one of the most important things that you can do for your health.  Try to exercise for at least 150 minutes each week. The type of exercise that you do should increase your heart rate and make you sweat. This is known as moderate-intensity exercise.  Try to do strengthening exercises at least twice each week. Do these in addition to the moderate-intensity exercise.  Know your numbers.Ask your health care provider to check your cholesterol and your blood glucose. Continue to have your blood tested as directed by your health care provider. What should I know about cancer screening? There are several types of cancer. Take the following steps to reduce your risk and to catch any cancer development as early as possible. Breast Cancer  Practice breast self-awareness.  This means understanding how your breasts normally appear and feel.  It also means doing regular breast self-exams. Let your health care provider know about any changes, no matter how small.  If you are 40 or older,  have a clinician do a breast exam (clinical breast exam or CBE) every year. Depending on your age, family history, and medical history, it may be recommended that you also have a yearly breast X-ray (mammogram).  If you  have a family history of breast cancer, talk with your health care provider about genetic screening.  If you are at high risk for breast cancer, talk with your health care provider about having an MRI and a mammogram every year.  Breast cancer (BRCA) gene test is recommended for women who have family members with BRCA-related cancers. Results of the assessment will determine the need for genetic counseling and BRCA1 and for BRCA2 testing. BRCA-related cancers include these types:  Breast. This occurs in males or females.  Ovarian.  Tubal. This may also be called fallopian tube cancer.  Cancer of the abdominal or pelvic lining (peritoneal cancer).  Prostate.  Pancreatic. Cervical, Uterine, and Ovarian Cancer  Your health care provider may recommend that you be screened regularly for cancer of the pelvic organs. These include your ovaries, uterus, and vagina. This screening involves a pelvic exam, which includes checking for microscopic changes to the surface of your cervix (Pap test).  For women ages 21-65, health care providers may recommend a pelvic exam and a Pap test every three years. For women ages 23-65, they may recommend the Pap test and pelvic exam, combined with testing for human papilloma virus (HPV), every five years. Some types of HPV increase your risk of cervical cancer. Testing for HPV may also be done on women of any age who have unclear Pap test results.  Other health care providers may not recommend any screening for nonpregnant women who are considered low risk for pelvic cancer and have no symptoms. Ask your health care provider if a screening pelvic exam is right for you.  If you have had past treatment for cervical cancer or a condition that could lead to cancer, you need Pap tests and screening for cancer for at least 20 years after your treatment. If Pap tests have been discontinued for you, your risk factors (such as having a new sexual partner) need to be reassessed  to determine if you should start having screenings again. Some women have medical problems that increase the chance of getting cervical cancer. In these cases, your health care provider may recommend that you have screening and Pap tests more often.  If you have a family history of uterine cancer or ovarian cancer, talk with your health care provider about genetic screening.  If you have vaginal bleeding after reaching menopause, tell your health care provider.  There are currently no reliable tests available to screen for ovarian cancer. Lung Cancer  Lung cancer screening is recommended for adults 99-83 years old who are at high risk for lung cancer because of a history of smoking. A yearly low-dose CT scan of the lungs is recommended if you:  Currently smoke.  Have a history of at least 30 pack-years of smoking and you currently smoke or have quit within the past 15 years. A pack-year is smoking an average of one pack of cigarettes per day for one year. Yearly screening should:  Continue until it has been 15 years since you quit.  Stop if you develop a health problem that would prevent you from having lung cancer treatment. Colorectal Cancer  This type of cancer can be detected and can often be prevented.  Routine colorectal cancer screening usually begins at age 72 and continues  through age 75.  If you have risk factors for colon cancer, your health care provider may recommend that you be screened at an earlier age.  If you have a family history of colorectal cancer, talk with your health care provider about genetic screening.  Your health care provider may also recommend using home test kits to check for hidden blood in your stool.  A small camera at the end of a tube can be used to examine your colon directly (sigmoidoscopy or colonoscopy). This is done to check for the earliest forms of colorectal cancer.  Direct examination of the colon should be repeated every 5-10 years until  age 75. However, if early forms of precancerous polyps or small growths are found or if you have a family history or genetic risk for colorectal cancer, you may need to be screened more often. Skin Cancer  Check your skin from head to toe regularly.  Monitor any moles. Be sure to tell your health care provider:  About any new moles or changes in moles, especially if there is a change in a mole's shape or color.  If you have a mole that is larger than the size of a pencil eraser.  If any of your family members has a history of skin cancer, especially at a [place] age, talk with your health care provider about genetic screening.  Always use sunscreen. Apply sunscreen liberally and repeatedly throughout the day.  Whenever you are outside, protect yourself by wearing long sleeves, pants, a wide-brimmed hat, and sunglasses. What should I know about osteoporosis? Osteoporosis is a condition in which bone destruction happens more quickly than new bone creation. After menopause, you may be at an increased risk for osteoporosis. To help prevent osteoporosis or the bone fractures that can happen because of osteoporosis, the following is recommended:  If you are 19-50 years old, get at least 1,000 mg of calcium and at least 600 mg of vitamin D per day.  If you are older than age 50 but younger than age 70, get at least 1,200 mg of calcium and at least 600 mg of vitamin D per day.  If you are older than age 70, get at least 1,200 mg of calcium and at least 800 mg of vitamin D per day. Smoking and excessive alcohol intake increase the risk of osteoporosis. Eat foods that are rich in calcium and vitamin D, and do weight-bearing exercises several times each week as directed by your health care provider. What should I know about how menopause affects my mental health? Depression may occur at any age, but it is more common as you become older. Common symptoms of depression include:  Low or sad  mood.  Changes in sleep patterns.  Changes in appetite or eating patterns.  Feeling an overall lack of motivation or enjoyment of activities that you previously enjoyed.  Frequent crying spells. Talk with your health care provider if you think that you are experiencing depression. What should I know about immunizations? It is important that you get and maintain your immunizations. These include:  Tetanus, diphtheria, and pertussis (Tdap) booster vaccine.  Influenza every year before the flu season begins.  Pneumonia vaccine.  Shingles vaccine. Your health care provider may also recommend other immunizations. This information is not intended to replace advice given to you by your health care provider. Make sure you discuss any questions you have with your health care provider. Document Released: 01/27/2006 Document Revised: 06/24/2016 Document Reviewed: 09/08/2015 Elsevier Interactive Patient   Education  2017 [employer]  

## 2017-03-10 NOTE — Progress Notes (Signed)
Karrina Lye [place] May 05, 1956 [id number]    History:    Presents for annual exam.  Postmenopausal with no bleeding, discharge or HRT. 2016  5 cm left ovarian cyst and ovary removed, bilateral salpingectomy benign, surgery done in [facility]. Not sexually active/partner prostatectomy. Normal PAP  and mammogram history. History of migraines. Depression, anxiety stable on Wellbutrin. 2017 DEXA T score - -2.2 at spine, -1.4 and hip average FRAX 8.7%/0.9%. Stressful situation last year, lost her mom after 9 months illness, father in the hospital with stroke and MI today.   Past medical history, past surgical history, family history and social history were all reviewed and documented in the EPIC chart.Works as an Optometrist for [employer]. One son  lives out of town. Shingles and first Pneumonia vaccines completed. Colonoscopy 2013 /negative  ROS:  A ROS was performed and pertinent positives and negatives are included.  Exam:  Vitals:   03/10/17 1553 03/10/17 1621  BP: (!) 150/90 140/90  Weight: 147 lb (66.7 kg)   Height: 5' 2.5" (1.588 m)    Body mass index is 26.46 kg/m.   General appearance:  Normal Thyroid:  Symmetrical, normal in size, without palpable masses or nodularity. Respiratory  Auscultation:  Clear without wheezing or rhonchi Cardiovascular  Auscultation:  Regular rate, without rubs, murmurs or gallops  Edema/varicosities:  Not grossly evident Abdominal  Soft,nontender, without masses, guarding or rebound.  Liver/spleen:  No organomegaly noted  Hernia:  None appreciated  Skin  Inspection:  Grossly normal   Breasts: Examined lying and sitting.     Right: Without masses, retractions, discharge or axillary adenopathy.     Left: Without masses, retractions, discharge or axillary adenopathy. Gentitourinary   Inguinal/mons:  Normal without inguinal adenopathy  External genitalia:  Normal  BUS/Urethra/Skene's glands:  Normal  Vagina:  atrophy  Cervix:  Normal  Uterus: ,  normal in size, shape and contour.  Midline and mobile  Adnexa/parametria:     Rt: Without masses or tenderness.   Lt: Without masses or tenderness.  Anus and perineum: Normal  Digital rectal exam: Normal sphincter tone without palpated masses or tenderness  Assessment/Plan:  61 y.o.  for annual exam. DWF G2P1 with no complaints.  Postmenopausal/no bleeding/no HRT/not sexually active Osteopenia without elevated FRAX Depression/Anxiety stable on Wellbutrin 2016 bilateral salpingectomy with left oophorectomy /benign ovarian mass -done in [place] care  Plan: SBEs, continue annual screening mammogram scheduled 03/2017,  healthy diet, calcium rich diet, increase exercise daily 30 minutes.  Safety, fall prevention and vitamin D 2000 daily encouraged. Wellbutrin 150 mg  daily,  oxycodone 5mg  TID as needed, # 30 aware to use sparingly has foot pain/bunions uses one prescription every 3 or 4 months. Aware of addictive properties.. Stress relief methods reviewed. Pap normal 01/2016 negative high risk HPV new screening guidelines reviewed.       [place], 4:36 PM 03/10/2017

## 2017-03-31 ENCOUNTER — Encounter: Payer: Self-pay | Admitting: Women's Health

## 2017-03-31 DIAGNOSIS — Z1231 Encounter for screening mammogram for malignant neoplasm of breast: Secondary | ICD-10-CM | POA: Diagnosis not present

## 2017-05-03 ENCOUNTER — Encounter: Payer: Self-pay | Admitting: Gynecology

## 2017-05-10 DIAGNOSIS — M25511 Pain in right shoulder: Secondary | ICD-10-CM | POA: Diagnosis not present

## 2017-05-10 DIAGNOSIS — M542 Cervicalgia: Secondary | ICD-10-CM | POA: Diagnosis not present

## 2017-07-06 DIAGNOSIS — M542 Cervicalgia: Secondary | ICD-10-CM | POA: Diagnosis not present

## 2017-07-06 DIAGNOSIS — M25511 Pain in right shoulder: Secondary | ICD-10-CM | POA: Diagnosis not present

## 2017-07-06 DIAGNOSIS — M47812 Spondylosis without myelopathy or radiculopathy, cervical region: Secondary | ICD-10-CM | POA: Diagnosis not present

## 2017-07-19 DIAGNOSIS — M546 Pain in thoracic spine: Secondary | ICD-10-CM | POA: Diagnosis not present

## 2017-07-19 DIAGNOSIS — M542 Cervicalgia: Secondary | ICD-10-CM | POA: Diagnosis not present

## 2017-08-02 DIAGNOSIS — G8929 Other chronic pain: Secondary | ICD-10-CM | POA: Diagnosis not present

## 2017-08-02 DIAGNOSIS — M542 Cervicalgia: Secondary | ICD-10-CM | POA: Diagnosis not present

## 2017-08-02 DIAGNOSIS — M47812 Spondylosis without myelopathy or radiculopathy, cervical region: Secondary | ICD-10-CM | POA: Diagnosis not present

## 2017-08-02 DIAGNOSIS — M546 Pain in thoracic spine: Secondary | ICD-10-CM | POA: Diagnosis not present

## 2017-08-09 DIAGNOSIS — G8929 Other chronic pain: Secondary | ICD-10-CM | POA: Diagnosis not present

## 2017-08-09 DIAGNOSIS — M546 Pain in thoracic spine: Secondary | ICD-10-CM | POA: Diagnosis not present

## 2017-08-09 DIAGNOSIS — M542 Cervicalgia: Secondary | ICD-10-CM | POA: Diagnosis not present

## 2017-08-09 DIAGNOSIS — M50822 Other cervical disc disorders at C5-C6 level: Secondary | ICD-10-CM | POA: Diagnosis not present

## 2017-08-12 DIAGNOSIS — K0510 Chronic gingivitis, plaque induced: Secondary | ICD-10-CM | POA: Diagnosis not present

## 2017-08-12 DIAGNOSIS — K0889 Other specified disorders of teeth and supporting structures: Secondary | ICD-10-CM | POA: Diagnosis not present

## 2017-09-12 DIAGNOSIS — M542 Cervicalgia: Secondary | ICD-10-CM | POA: Diagnosis not present

## 2017-09-21 DIAGNOSIS — M50822 Other cervical disc disorders at C5-C6 level: Secondary | ICD-10-CM | POA: Diagnosis not present

## 2017-09-21 DIAGNOSIS — M546 Pain in thoracic spine: Secondary | ICD-10-CM | POA: Diagnosis not present

## 2017-09-21 DIAGNOSIS — G8929 Other chronic pain: Secondary | ICD-10-CM | POA: Diagnosis not present

## 2017-09-21 DIAGNOSIS — M50823 Other cervical disc disorders at C6-C7 level: Secondary | ICD-10-CM | POA: Diagnosis not present

## 2017-09-26 DIAGNOSIS — M21612 Bunion of left foot: Secondary | ICD-10-CM | POA: Diagnosis not present

## 2017-09-26 DIAGNOSIS — M216X1 Other acquired deformities of right foot: Secondary | ICD-10-CM | POA: Diagnosis not present

## 2017-09-26 DIAGNOSIS — M216X2 Other acquired deformities of left foot: Secondary | ICD-10-CM | POA: Diagnosis not present

## 2017-09-26 DIAGNOSIS — M21611 Bunion of right foot: Secondary | ICD-10-CM | POA: Diagnosis not present

## 2017-10-11 DIAGNOSIS — M21612 Bunion of left foot: Secondary | ICD-10-CM | POA: Diagnosis not present

## 2017-10-11 DIAGNOSIS — M21611 Bunion of right foot: Secondary | ICD-10-CM | POA: Diagnosis not present

## 2017-10-25 HISTORY — PX: FOOT SURGERY: SHX648

## 2017-11-02 DIAGNOSIS — M47812 Spondylosis without myelopathy or radiculopathy, cervical region: Secondary | ICD-10-CM | POA: Diagnosis not present

## 2017-11-06 DIAGNOSIS — M21611 Bunion of right foot: Secondary | ICD-10-CM | POA: Diagnosis not present

## 2017-11-06 DIAGNOSIS — M21612 Bunion of left foot: Secondary | ICD-10-CM | POA: Diagnosis not present

## 2017-12-01 DIAGNOSIS — M50822 Other cervical disc disorders at C5-C6 level: Secondary | ICD-10-CM | POA: Diagnosis not present

## 2017-12-01 DIAGNOSIS — M50823 Other cervical disc disorders at C6-C7 level: Secondary | ICD-10-CM | POA: Diagnosis not present

## 2017-12-28 DIAGNOSIS — M5412 Radiculopathy, cervical region: Secondary | ICD-10-CM | POA: Diagnosis not present

## 2018-02-12 DIAGNOSIS — Z79891 Long term (current) use of opiate analgesic: Secondary | ICD-10-CM | POA: Diagnosis not present

## 2018-02-12 DIAGNOSIS — M5030 Other cervical disc degeneration, unspecified cervical region: Secondary | ICD-10-CM | POA: Diagnosis not present

## 2018-02-12 DIAGNOSIS — G894 Chronic pain syndrome: Secondary | ICD-10-CM | POA: Diagnosis not present

## 2018-02-12 DIAGNOSIS — M5412 Radiculopathy, cervical region: Secondary | ICD-10-CM | POA: Diagnosis not present

## 2018-02-13 DIAGNOSIS — L918 Other hypertrophic disorders of the skin: Secondary | ICD-10-CM | POA: Diagnosis not present

## 2018-02-13 DIAGNOSIS — L821 Other seborrheic keratosis: Secondary | ICD-10-CM | POA: Diagnosis not present

## 2018-02-13 DIAGNOSIS — L820 Inflamed seborrheic keratosis: Secondary | ICD-10-CM | POA: Diagnosis not present

## 2018-06-11 DIAGNOSIS — M5136 Other intervertebral disc degeneration, lumbar region: Secondary | ICD-10-CM | POA: Diagnosis not present

## 2018-06-11 DIAGNOSIS — M5412 Radiculopathy, cervical region: Secondary | ICD-10-CM | POA: Diagnosis not present

## 2018-06-11 DIAGNOSIS — Z79891 Long term (current) use of opiate analgesic: Secondary | ICD-10-CM | POA: Diagnosis not present

## 2018-06-11 DIAGNOSIS — G894 Chronic pain syndrome: Secondary | ICD-10-CM | POA: Diagnosis not present

## 2018-07-04 ENCOUNTER — Ambulatory Visit (INDEPENDENT_AMBULATORY_CARE_PROVIDER_SITE_OTHER): Payer: BLUE CROSS/BLUE SHIELD | Admitting: Women's Health

## 2018-07-04 ENCOUNTER — Encounter: Payer: Self-pay | Admitting: Women's Health

## 2018-07-04 VITALS — BP 124/76 | Ht 63.0 in | Wt 147.8 lb

## 2018-07-04 DIAGNOSIS — Z01419 Encounter for gynecological examination (general) (routine) without abnormal findings: Secondary | ICD-10-CM | POA: Diagnosis not present

## 2018-07-04 DIAGNOSIS — M542 Cervicalgia: Secondary | ICD-10-CM

## 2018-07-04 DIAGNOSIS — E559 Vitamin D deficiency, unspecified: Secondary | ICD-10-CM | POA: Diagnosis not present

## 2018-07-04 DIAGNOSIS — F3289 Other specified depressive episodes: Secondary | ICD-10-CM

## 2018-07-04 DIAGNOSIS — Z13220 Encounter for screening for lipoid disorders: Secondary | ICD-10-CM | POA: Diagnosis not present

## 2018-07-04 DIAGNOSIS — Z13820 Encounter for screening for osteoporosis: Secondary | ICD-10-CM | POA: Diagnosis not present

## 2018-07-04 MED ORDER — BUPROPION HCL ER (SR) 150 MG PO TB12: 150.0000 mg | ORAL_TABLET | Freq: Two times a day (BID) | ORAL | 4 refills | Status: DC

## 2018-07-04 MED ORDER — CYCLOBENZAPRINE HCL 5 MG PO TABS: 5.0000 mg | ORAL_TABLET | ORAL | 1 refills | Status: DC | PRN

## 2018-07-04 NOTE — Patient Instructions (Signed)
Health Maintenance for Postmenopausal Women Menopause is a normal process in which your reproductive ability comes to an end. This process happens gradually over a span of months to years, usually between the ages of 22 and 9. Menopause is complete when you have missed 12 consecutive menstrual periods. It is important to talk with your health care provider about some of the most common conditions that affect postmenopausal women, such as heart disease, cancer, and bone loss (osteoporosis). Adopting a healthy lifestyle and getting preventive care can help to promote your health and wellness. Those actions can also lower your chances of developing some of these common conditions. What should I know about menopause? During menopause, you may experience a number of symptoms, such as:  Moderate-to-severe hot flashes.  Night sweats.  Decrease in sex drive.  Mood swings.  Headaches.  Tiredness.  Irritability.  Memory problems.  Insomnia.  Choosing to treat or not to treat menopausal changes is an individual decision that you make with your health care provider. What should I know about hormone replacement therapy and supplements? Hormone therapy products are effective for treating symptoms that are associated with menopause, such as hot flashes and night sweats. Hormone replacement carries certain risks, especially as you become older. If you are thinking about using estrogen or estrogen with progestin treatments, discuss the benefits and risks with your health care provider. What should I know about heart disease and stroke? Heart disease, heart attack, and stroke become more likely as you age. This may be due, in part, to the hormonal changes that your body experiences during menopause. These can affect how your body processes dietary fats, triglycerides, and cholesterol. Heart attack and stroke are both medical emergencies. There are many things that you can do to help prevent heart disease  and stroke:  Have your blood pressure checked at least every 1-2 years. High blood pressure causes heart disease and increases the risk of stroke.  If you are 53-22 years old, ask your health care provider if you should take aspirin to prevent a heart attack or a stroke.  Do not use any tobacco products, including cigarettes, chewing tobacco, or electronic cigarettes. If you need help quitting, ask your health care provider.  It is important to eat a healthy diet and maintain a healthy weight. ? Be sure to include plenty of vegetables, fruits, low-fat dairy products, and lean protein. ? Avoid eating foods that are high in solid fats, added sugars, or salt (sodium).  Get regular exercise. This is one of the most important things that you can do for your health. ? Try to exercise for at least 150 minutes each week. The type of exercise that you do should increase your heart rate and make you sweat. This is known as moderate-intensity exercise. ? Try to do strengthening exercises at least twice each week. Do these in addition to the moderate-intensity exercise.  Know your numbers.Ask your health care provider to check your cholesterol and your blood glucose. Continue to have your blood tested as directed by your health care provider.  What should I know about cancer screening? There are several types of cancer. Take the following steps to reduce your risk and to catch any cancer development as early as possible. Breast Cancer  Practice breast self-awareness. ? This means understanding how your breasts normally appear and feel. ? It also means doing regular breast self-exams. Let your health care provider know about any changes, no matter how small.  If you are 40  or older, have a clinician do a breast exam (clinical breast exam or CBE) every year. Depending on your age, family history, and medical history, it may be recommended that you also have a yearly breast X-ray (mammogram).  If you  have a family history of breast cancer, talk with your health care provider about genetic screening.  If you are at high risk for breast cancer, talk with your health care provider about having an MRI and a mammogram every year.  Breast cancer (BRCA) gene test is recommended for women who have family members with BRCA-related cancers. Results of the assessment will determine the need for genetic counseling and BRCA1 and for BRCA2 testing. BRCA-related cancers include these types: ? Breast. This occurs in males or females. ? Ovarian. ? Tubal. This may also be called fallopian tube cancer. ? Cancer of the abdominal or pelvic lining (peritoneal cancer). ? Prostate. ? Pancreatic.  Cervical, Uterine, and Ovarian Cancer Your health care provider may recommend that you be screened regularly for cancer of the pelvic organs. These include your ovaries, uterus, and vagina. This screening involves a pelvic exam, which includes checking for microscopic changes to the surface of your cervix (Pap test).  For women ages 21-65, health care providers may recommend a pelvic exam and a Pap test every three years. For women ages 79-65, they may recommend the Pap test and pelvic exam, combined with testing for human papilloma virus (HPV), every five years. Some types of HPV increase your risk of cervical cancer. Testing for HPV may also be done on women of any age who have unclear Pap test results.  Other health care providers may not recommend any screening for nonpregnant women who are considered low risk for pelvic cancer and have no symptoms. Ask your health care provider if a screening pelvic exam is right for you.  If you have had past treatment for cervical cancer or a condition that could lead to cancer, you need Pap tests and screening for cancer for at least 20 years after your treatment. If Pap tests have been discontinued for you, your risk factors (such as having a new sexual partner) need to be  reassessed to determine if you should start having screenings again. Some women have medical problems that increase the chance of getting cervical cancer. In these cases, your health care provider may recommend that you have screening and Pap tests more often.  If you have a family history of uterine cancer or ovarian cancer, talk with your health care provider about genetic screening.  If you have vaginal bleeding after reaching menopause, tell your health care provider.  There are currently no reliable tests available to screen for ovarian cancer.  Lung Cancer Lung cancer screening is recommended for adults 69-62 years old who are at high risk for lung cancer because of a history of smoking. A yearly low-dose CT scan of the lungs is recommended if you:  Currently smoke.  Have a history of at least 30 pack-years of smoking and you currently smoke or have quit within the past 15 years. A pack-year is smoking an average of one pack of cigarettes per day for one year.  Yearly screening should:  Continue until it has been 15 years since you quit.  Stop if you develop a health problem that would prevent you from having lung cancer treatment.  Colorectal Cancer  This type of cancer can be detected and can often be prevented.  Routine colorectal cancer screening usually begins at  age 42 and continues through age 45.  If you have risk factors for colon cancer, your health care provider may recommend that you be screened at an earlier age.  If you have a family history of colorectal cancer, talk with your health care provider about genetic screening.  Your health care provider may also recommend using home test kits to check for hidden blood in your stool.  A small camera at the end of a tube can be used to examine your colon directly (sigmoidoscopy or colonoscopy). This is done to check for the earliest forms of colorectal cancer.  Direct examination of the colon should be repeated every  5-10 years until age 71. However, if early forms of precancerous polyps or small growths are found or if you have a family history or genetic risk for colorectal cancer, you may need to be screened more often.  Skin Cancer  Check your skin from head to toe regularly.  Monitor any moles. Be sure to tell your health care provider: ? About any new moles or changes in moles, especially if there is a change in a mole's shape or color. ? If you have a mole that is larger than the size of a pencil eraser.  If any of your family members has a history of skin cancer, especially at a Kerie Badger age, talk with your health care provider about genetic screening.  Always use sunscreen. Apply sunscreen liberally and repeatedly throughout the day.  Whenever you are outside, protect yourself by wearing long sleeves, pants, a wide-brimmed hat, and sunglasses.  What should I know about osteoporosis? Osteoporosis is a condition in which bone destruction happens more quickly than new bone creation. After menopause, you may be at an increased risk for osteoporosis. To help prevent osteoporosis or the bone fractures that can happen because of osteoporosis, the following is recommended:  If you are 46-71 years old, get at least 1,000 mg of calcium and at least 600 mg of vitamin D per day.  If you are older than age 55 but younger than age 65, get at least 1,200 mg of calcium and at least 600 mg of vitamin D per day.  If you are older than age 54, get at least 1,200 mg of calcium and at least 800 mg of vitamin D per day.  Smoking and excessive alcohol intake increase the risk of osteoporosis. Eat foods that are rich in calcium and vitamin D, and do weight-bearing exercises several times each week as directed by your health care provider. What should I know about how menopause affects my mental health? Depression may occur at any age, but it is more common as you become older. Common symptoms of depression  include:  Low or sad mood.  Changes in sleep patterns.  Changes in appetite or eating patterns.  Feeling an overall lack of motivation or enjoyment of activities that you previously enjoyed.  Frequent crying spells.  Talk with your health care provider if you think that you are experiencing depression. What should I know about immunizations? It is important that you get and maintain your immunizations. These include:  Tetanus, diphtheria, and pertussis (Tdap) booster vaccine.  Influenza every year before the flu season begins.  Pneumonia vaccine.  Shingles vaccine.  Your health care provider may also recommend other immunizations. This information is not intended to replace advice given to you by your health care provider. Make sure you discuss any questions you have with your health care provider. Document Released: 01/27/2006  Document Revised: 06/24/2016 Document Reviewed: 09/08/2015 Elsevier Interactive Patient Education  2018 [employer]vier Inc[employer]  

## 2018-07-04 NOTE — Progress Notes (Signed)
Ameira Alessandrini Thul 1955/12/23 [id number]    History:    Presents for annual exam.  Postmenopausal on no HRT with no bleeding.  2016 LSO with BS 5 cm left ovarian cyst benign removed.  2017 T score -2.2 at spine -1.4 at hip FRAX 8.7% / 0.9%.  2013- colonoscopy current on vaccines.  Same partner not sexually active.  Fianc of greater than 13 years struggling with prostate cancer, in remission with bladder problems after radiation, 2 bladder surgeries and numerous ER visits.  Past medical history, past surgical history, family history and social history were all reviewed and documented in the EPIC chart.  One son 80 doing well.  ROS:  A ROS was performed and pertinent positives and negatives are included.  Exam:  Vitals:   07/04/18 1609  BP: 124/76  Weight: 147 lb 12.8 oz (67 kg)  Height: 5\' 3"  (1.6 m)   Body mass index is 26.18 kg/m.   General appearance:  Normal Thyroid:  Symmetrical, normal in size, without palpable masses or nodularity. Respiratory  Auscultation:  Clear without wheezing or rhonchi Cardiovascular  Auscultation:  Regular rate, without rubs, murmurs or gallops  Edema/varicosities:  Not grossly evident Abdominal  Soft,nontender, without masses, guarding or rebound.  Liver/spleen:  No organomegaly noted  Hernia:  None appreciated  Skin  Inspection:  Grossly normal   Breasts: Examined lying and sitting.     Right: Without masses, retractions, discharge or axillary adenopathy.     Left: Without masses, retractions, discharge or axillary adenopathy. Gentitourinary   Inguinal/mons:  Normal without inguinal adenopathy  External genitalia:  Normal  BUS/Urethra/Skene's glands:  Normal  Vagina:  Normal  Cervix:  Normal  Uterus: normal in size, shape and contour.  Midline and mobile  Adnexa/parametria:     Rt: Without masses or tenderness.   Lt: Without masses or tenderness.  Anus and perineum: Normal  Digital rectal exam: Normal sphincter tone without palpated masses  or tenderness  Assessment/Plan:  62 y.o. engaged WF G2, P1 for annual exam with no GYN complaints.  Postmenopausal/no HRT/no bleeding Anxiety/ depression stable on Wellbutrin Chronic neck pain has follow-up scheduled with orthopedist Osteopenia without elevated FRAX  Plan: Repeat DEXA, will return to office fasting for CBC, lipid panel, CMP vitamin D.  Refill of Wellbutrin 150 twice daily prescription, proper use given and reviewed, counseling as needed.  Flexeril 5 mg p.o as needed aware of addictive properties to use sparingly, will get refill if needed with orthopedist.  SBE's, continue annual screening mammogram, calcium rich foods, vitamin D 2000 daily encouraged..  Safety, fall prevention and importance of weightbearing exercise reviewed.  Leisure activities encouraged.  Pap normal 2017, new screening guidelines reviewed.    [name] [facility], 4:40 PM 07/04/2018

## 2018-08-30 ENCOUNTER — Other Ambulatory Visit: Payer: BLUE CROSS/BLUE SHIELD

## 2018-09-18 ENCOUNTER — Encounter: Payer: Self-pay | Admitting: Gynecology

## 2018-09-18 ENCOUNTER — Other Ambulatory Visit: Payer: Self-pay | Admitting: Gynecology

## 2018-09-18 ENCOUNTER — Ambulatory Visit (INDEPENDENT_AMBULATORY_CARE_PROVIDER_SITE_OTHER): Payer: BLUE CROSS/BLUE SHIELD

## 2018-09-18 ENCOUNTER — Other Ambulatory Visit: Payer: BLUE CROSS/BLUE SHIELD

## 2018-09-18 DIAGNOSIS — Z13220 Encounter for screening for lipoid disorders: Secondary | ICD-10-CM | POA: Diagnosis not present

## 2018-09-18 DIAGNOSIS — Z13820 Encounter for screening for osteoporosis: Secondary | ICD-10-CM

## 2018-09-18 DIAGNOSIS — M8589 Other specified disorders of bone density and structure, multiple sites: Secondary | ICD-10-CM

## 2018-09-18 DIAGNOSIS — E559 Vitamin D deficiency, unspecified: Secondary | ICD-10-CM | POA: Diagnosis not present

## 2018-09-18 DIAGNOSIS — Z01419 Encounter for gynecological examination (general) (routine) without abnormal findings: Secondary | ICD-10-CM

## 2018-09-19 LAB — CBC WITH DIFFERENTIAL/PLATELET
Basophils Absolute: 48 cells/uL (ref 0–200)
Basophils Relative: 1.1 %
Eosinophils Absolute: 101 cells/uL (ref 15–500)
Eosinophils Relative: 2.3 %
HEMATOCRIT: 39.9 % (ref 35.0–45.0)
HEMOGLOBIN: 13.6 g/dL (ref 11.7–15.5)
Lymphs Abs: 1316 cells/uL (ref 850–3900)
MCH: 29.5 pg (ref 27.0–33.0)
MCHC: 34.1 g/dL (ref 32.0–36.0)
MCV: 86.6 fL (ref 80.0–100.0)
MPV: 11.1 fL (ref 7.5–12.5)
Monocytes Relative: 9.9 %
NEUTROS ABS: 2499 {cells}/uL (ref 1500–7800)
Neutrophils Relative %: 56.8 %
Platelets: 241 10*3/uL (ref 140–400)
RBC: 4.61 10*6/uL (ref 3.80–5.10)
RDW: 12.5 % (ref 11.0–15.0)
Total Lymphocyte: 29.9 %
WBC: 4.4 10*3/uL (ref 3.8–10.8)
WBCMIX: 436 {cells}/uL (ref 200–950)

## 2018-09-19 LAB — LIPID PANEL
Cholesterol: 201 mg/dL — ABNORMAL HIGH (ref <200)
HDL: 52 mg/dL (ref >50)
LDL CHOLESTEROL (CALC): 126 mg/dL — AB
NON-HDL CHOLESTEROL (CALC): 149 mg/dL — AB (ref <130)
Total CHOL/HDL Ratio: 3.9 (calc) (ref <5.0)
Triglycerides: 124 mg/dL (ref <150)

## 2018-09-19 LAB — COMPREHENSIVE METABOLIC PANEL
AG Ratio: 2.3 (calc) (ref 1.0–2.5)
ALT: 12 U/L (ref 6–29)
AST: 15 U/L (ref 10–35)
Albumin: 4.3 g/dL (ref 3.6–5.1)
Alkaline phosphatase (APISO): 97 U/L (ref 33–130)
BUN/Creatinine Ratio: 16 (calc) (ref 6–22)
BUN: 16 mg/dL (ref 7–25)
CO2: 28 mmol/L (ref 20–32)
CREATININE: 1 mg/dL — AB (ref 0.50–0.99)
Calcium: 9.6 mg/dL (ref 8.6–10.4)
Chloride: 105 mmol/L (ref 98–110)
GLUCOSE: 89 mg/dL (ref 65–99)
Globulin: 1.9 g/dL (calc) (ref 1.9–3.7)
Potassium: 3.8 mmol/L (ref 3.5–5.3)
Sodium: 140 mmol/L (ref 135–146)
Total Bilirubin: 0.5 mg/dL (ref 0.2–1.2)
Total Protein: 6.2 g/dL (ref 6.1–8.1)

## 2018-09-19 LAB — VITAMIN D 25 HYDROXY (VIT D DEFICIENCY, FRACTURES): Vit D, 25-Hydroxy: 43 ng/mL (ref 30–100)

## 2018-09-20 DIAGNOSIS — M5412 Radiculopathy, cervical region: Secondary | ICD-10-CM | POA: Diagnosis not present

## 2018-10-10 DIAGNOSIS — Z79899 Other long term (current) drug therapy: Secondary | ICD-10-CM | POA: Diagnosis not present

## 2018-10-10 DIAGNOSIS — M5412 Radiculopathy, cervical region: Secondary | ICD-10-CM | POA: Diagnosis not present

## 2019-01-09 DIAGNOSIS — L820 Inflamed seborrheic keratosis: Secondary | ICD-10-CM | POA: Diagnosis not present

## 2019-01-09 DIAGNOSIS — C44519 Basal cell carcinoma of skin of other part of trunk: Secondary | ICD-10-CM | POA: Diagnosis not present

## 2019-01-21 DIAGNOSIS — G894 Chronic pain syndrome: Secondary | ICD-10-CM | POA: Diagnosis not present

## 2019-01-21 DIAGNOSIS — M5412 Radiculopathy, cervical region: Secondary | ICD-10-CM | POA: Diagnosis not present

## 2019-01-21 DIAGNOSIS — M5030 Other cervical disc degeneration, unspecified cervical region: Secondary | ICD-10-CM | POA: Diagnosis not present

## 2019-01-21 DIAGNOSIS — M5136 Other intervertebral disc degeneration, lumbar region: Secondary | ICD-10-CM | POA: Diagnosis not present

## 2019-02-12 DIAGNOSIS — M5416 Radiculopathy, lumbar region: Secondary | ICD-10-CM | POA: Diagnosis not present

## 2019-02-12 DIAGNOSIS — M5136 Other intervertebral disc degeneration, lumbar region: Secondary | ICD-10-CM | POA: Diagnosis not present

## 2019-03-18 DIAGNOSIS — L821 Other seborrheic keratosis: Secondary | ICD-10-CM | POA: Diagnosis not present

## 2019-04-11 DIAGNOSIS — M5416 Radiculopathy, lumbar region: Secondary | ICD-10-CM | POA: Diagnosis not present

## 2019-04-11 DIAGNOSIS — M5136 Other intervertebral disc degeneration, lumbar region: Secondary | ICD-10-CM | POA: Diagnosis not present

## 2019-07-09 ENCOUNTER — Other Ambulatory Visit: Payer: Self-pay

## 2019-07-10 ENCOUNTER — Ambulatory Visit (INDEPENDENT_AMBULATORY_CARE_PROVIDER_SITE_OTHER): Payer: 59 | Admitting: Women's Health

## 2019-07-10 ENCOUNTER — Encounter: Payer: Self-pay | Admitting: Women's Health

## 2019-07-10 VITALS — BP 120/82 | Ht 63.0 in | Wt 142.0 lb

## 2019-07-10 DIAGNOSIS — Z01419 Encounter for gynecological examination (general) (routine) without abnormal findings: Secondary | ICD-10-CM

## 2019-07-10 DIAGNOSIS — Z13220 Encounter for screening for lipoid disorders: Secondary | ICD-10-CM

## 2019-07-10 DIAGNOSIS — F3289 Other specified depressive episodes: Secondary | ICD-10-CM

## 2019-07-10 MED ORDER — BUPROPION HCL ER (SR) 150 MG PO TB12: 150.0000 mg | ORAL_TABLET | Freq: Two times a day (BID) | ORAL | 4 refills | Status: DC

## 2019-07-10 NOTE — Patient Instructions (Addendum)
shingrex    Vit D3 2000iu    Health Maintenance for Postmenopausal Women Menopause is a normal process in which your ability to get pregnant comes to an end. This process happens slowly over many months or years, usually between the ages of 51 and 32. Menopause is complete when you have missed your menstrual periods for 12 months. It is important to talk with your health care provider about some of the most common conditions that affect women after menopause (postmenopausal women). These include heart disease, cancer, and bone loss (osteoporosis). Adopting a healthy lifestyle and getting preventive care can help to promote your health and wellness. The actions you take can also lower your chances of developing some of these common conditions. What should I know about menopause? During menopause, you may get a number of symptoms, such as:  Hot flashes. These can be moderate or severe.  Night sweats.  Decrease in sex drive.  Mood swings.  Headaches.  Tiredness.  Irritability.  Memory problems.  Insomnia. Choosing to treat or not to treat these symptoms is a decision that you make with your health care provider. Do I need hormone replacement therapy?  Hormone replacement therapy is effective in treating symptoms that are caused by menopause, such as hot flashes and night sweats.  Hormone replacement carries certain risks, especially as you become older. If you are thinking about using estrogen or estrogen with progestin, discuss the benefits and risks with your health care provider. What is my risk for heart disease and stroke? The risk of heart disease, heart attack, and stroke increases as you age. One of the causes may be a change in the body's hormones during menopause. This can affect how your body uses dietary fats, triglycerides, and cholesterol. Heart attack and stroke are medical emergencies. There are many things that you can do to help prevent heart disease and stroke.  Watch your blood pressure  High blood pressure causes heart disease and increases the risk of stroke. This is more likely to develop in people who have high blood pressure readings, are of African descent, or are overweight.  Have your blood pressure checked: ? Every 3-5 years if you are 59-57 years of age. ? Every year if you are 64 years old or older. Eat a healthy diet   Eat a diet that includes plenty of vegetables, fruits, low-fat dairy products, and lean protein.  Do not eat a lot of foods that are high in solid fats, added sugars, or sodium. Get regular exercise Get regular exercise. This is one of the most important things you can do for your health. Most adults should:  Try to exercise for at least 150 minutes each week. The exercise should increase your heart rate and make you sweat (moderate-intensity exercise).  Try to do strengthening exercises at least twice each week. Do these in addition to the moderate-intensity exercise.  Spend less time sitting. Even light physical activity can be beneficial. Other tips  Work with your health care provider to achieve or maintain a healthy weight.  Do not use any products that contain nicotine or tobacco, such as cigarettes, e-cigarettes, and chewing tobacco. If you need help quitting, ask your health care provider.  Know your numbers. Ask your health care provider to check your cholesterol and your blood sugar (glucose). Continue to have your blood tested as directed by your health care provider. Do I need screening for cancer? Depending on your health history and family history, you may need to  have cancer screening at different stages of your life. This may include screening for:  Breast cancer.  Cervical cancer.  Lung cancer.  Colorectal cancer. What is my risk for osteoporosis? After menopause, you may be at increased risk for osteoporosis. Osteoporosis is a condition in which bone destruction happens more quickly than  new bone creation. To help prevent osteoporosis or the bone fractures that can happen because of osteoporosis, you may take the following actions:  If you are 64-6 years old, get at least 1,000 mg of calcium and at least 600 mg of vitamin D per day.  If you are older than age 64 but younger than age 30, get at least 1,200 mg of calcium and at least 600 mg of vitamin D per day.  If you are older than age 63, get at least 1,200 mg of calcium and at least 800 mg of vitamin D per day. Smoking and drinking excessive alcohol increase the risk of osteoporosis. Eat foods that are rich in calcium and vitamin D, and do weight-bearing exercises several times each week as directed by your health care provider. How does menopause affect my mental health? Depression may occur at any age, but it is more common as you become older. Common symptoms of depression include:  Low or sad mood.  Changes in sleep patterns.  Changes in appetite or eating patterns.  Feeling an overall lack of motivation or enjoyment of activities that you previously enjoyed.  Frequent crying spells. Talk with your health care provider if you think that you are experiencing depression. General instructions See your health care provider for regular wellness exams and vaccines. This may include:  Scheduling regular health, dental, and eye exams.  Getting and maintaining your vaccines. These include: ? Influenza vaccine. Get this vaccine each year before the flu season begins. ? Pneumonia vaccine. ? Shingles vaccine. ? Tetanus, diphtheria, and pertussis (Tdap) booster vaccine. Your health care provider may also recommend other immunizations. Tell your health care provider if you have ever been abused or do not feel safe at home. Summary  Menopause is a normal process in which your ability to get pregnant comes to an end.  This condition causes hot flashes, night sweats, decreased interest in sex, mood swings, headaches, or  lack of sleep.  Treatment for this condition may include hormone replacement therapy.  Take actions to keep yourself healthy, including exercising regularly, eating a healthy diet, watching your weight, and checking your blood pressure and blood sugar levels.  Get screened for cancer and depression. Make sure that you are up to date with all your vaccines. This information is not intended to replace advice given to you by your health care provider. Make sure you discuss any questions you have with your health care provider. Document Released: 01/27/2006 Document Revised: 11/28/2018 Document Reviewed: 11/28/2018 Elsevier Patient Education  2020 [employer].

## 2019-07-10 NOTE — Progress Notes (Signed)
Michele [name] 03-16-1956 [id number]    History:    Presents for annual exam.  Postmenopausal on no HRT with no bleeding.  Has had problems with chronic back and neck pain has had some improvement with chiropractic treatment.  Normal Pap and mammogram history.  2016 LSO with bilateral salpingectomies for 5 cm left benign cyst.  Has had Shingrex.  2013- colonoscopy.  Had normal labs 09/2018.  2019 T score -2.2 at spine FRAX 10% / 1.3%.  Fianc of 14 years has prostate cancer and radiation caused bladder problems now has had bladder removed but is still struggling with health.   past medical history, past surgical history, family history and social history were all reviewed and documented in the EPIC chart.  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  Son is 10 doing well.  ROS:  A ROS was performed and pertinent positives and negatives are included.  Exam:  Vitals:   07/10/19 0800  BP: 120/82  Weight: 142 lb (64.4 kg)  Height: 5\' 3"  (1.6 m)   Body mass index is 25.15 kg/m.   General appearance:  Normal Thyroid:  Symmetrical, normal in size, without palpable masses or nodularity. Respiratory  Auscultation:  Clear without wheezing or rhonchi Cardiovascular  Auscultation:  Regular rate, without rubs, murmurs or gallops  Edema/varicosities:  Not grossly evident Abdominal  Soft,nontender, without masses, guarding or rebound.  Liver/spleen:  No organomegaly noted  Hernia:  None appreciated  Skin  Inspection:  Grossly normal   Breasts: Examined lying and sitting.     Right: Without masses, retractions, discharge or axillary adenopathy.     Left: Without masses, retractions, discharge or axillary adenopathy. Gentitourinary   Inguinal/mons:  Normal without inguinal adenopathy  External genitalia:  Normal  BUS/Urethra/Skene's glands:  Normal  Vagina:  Normal  Cervix:  Normal  Uterus:   normal in size, shape and  contour.  Midline and mobile  Adnexa/parametria:     Rt: Without masses or tenderness.   Lt: Without masses or tenderness.  Anus and perineum: Normal  Digital rectal exam: Normal sphincter tone without palpated masses or tenderness  Assessment/Plan:  63 y.o. engaged WF G2, P1 for annual exam with no GYN complaints.  Postmenopausal/no HRT/no bleeding Osteopenia without elevated FRAX Anxiety/depression stable on Wellbutrin Situational stress  Plan: Reviewed importance of self-care, leisure activities, weightbearing and balance type exercise.  Home safety, fall prevention discussed.  Wellbutrin 150 XL prescription, proper use given and reviewed.  Counseling as needed.  SBEs, last mammogram in chart is 2018 from [facility] states does think she has had a instructed to call [facility] have records released and schedule this years mammogram.  Vitamin D 2000 daily encouraged.  Pneumonia vaccine at age 63.  Will return to office fasting for CBC, CMP, lipid panel, Pap with HR HPV typing, new screening guidelines reviewed.    [place], 8:44 AM 07/10/2019

## 2019-07-10 NOTE — Addendum Note (Signed)
Addended by: Lorine Bears on: 07/10/2019 09:01 AM   Modules accepted: Orders

## 2019-07-11 LAB — PAP, TP IMAGING W/ HPV RNA, RFLX HPV TYPE 16,18/45: HPV DNA High Risk: NOT DETECTED

## 2019-08-23 ENCOUNTER — Ambulatory Visit (INDEPENDENT_AMBULATORY_CARE_PROVIDER_SITE_OTHER): Payer: 59 | Admitting: Physician Assistant

## 2019-08-23 ENCOUNTER — Encounter: Payer: Self-pay | Admitting: Physician Assistant

## 2019-08-23 VITALS — BP 132/74 | HR 64 | Temp 97.6°F | Ht 63.0 in | Wt 146.0 lb

## 2019-08-23 DIAGNOSIS — R12 Heartburn: Secondary | ICD-10-CM

## 2019-08-23 MED ORDER — PANTOPRAZOLE SODIUM 40 MG PO TBEC: 40.0000 mg | DELAYED_RELEASE_TABLET | Freq: Every day | ORAL | 1 refills | Status: DC

## 2019-08-23 NOTE — Progress Notes (Signed)
I agree with the above note, plan 
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
## 2019-08-23 NOTE — Patient Instructions (Signed)
We have sent the following medications to your pharmacy for you to pick up at your convenience: Pantoprazole 40 mg   Please review the GERD diet handout   Please call in two weeks if no better

## 2019-08-27 ENCOUNTER — Telehealth: Payer: Self-pay | Admitting: Physician Assistant

## 2019-08-27 ENCOUNTER — Telehealth: Payer: Self-pay

## 2019-08-27 ENCOUNTER — Other Ambulatory Visit: Payer: Self-pay | Admitting: Physician Assistant

## 2019-08-27 MED ORDER — DEXILANT 60 MG PO CPDR: 60.0000 mg | DELAYED_RELEASE_CAPSULE | Freq: Every day | ORAL | 1 refills | Status: DC

## 2019-08-27 NOTE — Telephone Encounter (Signed)
-----   Message from Levin Erp, [place] sent at 08/27/2019 11:57 AM EDT ----- We can do dexilant 60mg  qd. Thanks. JLL ----- Message ----- From: Angie Fava, LPN Sent: [id number]  075-GRM AM EDT To: Levin Erp, PA  Anderson [place], You saw this patient last week and started her on Pantoprazole 40 mg daily. I did a PA on her and insurance is still denying. The preferred medication that will be covered is Dexilant 60 mg or Aciphex. Please advise.  Thanks, [employer]

## 2019-08-27 NOTE — Telephone Encounter (Signed)
Spoke to patient to let her know that Dexilant 60 mg has been sent to her pharmacy.

## 2019-08-27 NOTE — Telephone Encounter (Signed)
Sent message to [place] the preferred medications after speaking with [facility]

## 2019-08-28 ENCOUNTER — Ambulatory Visit: Payer: 59 | Admitting: Physician Assistant

## 2019-09-17 ENCOUNTER — Encounter: Payer: Self-pay | Admitting: Gynecology

## 2019-11-28 ENCOUNTER — Encounter: Payer: Self-pay | Admitting: Women's Health

## 2020-02-10 ENCOUNTER — Other Ambulatory Visit: Payer: Self-pay | Admitting: Women's Health

## 2020-02-10 ENCOUNTER — Other Ambulatory Visit: Payer: 59

## 2020-02-10 ENCOUNTER — Other Ambulatory Visit: Payer: Self-pay

## 2020-02-10 DIAGNOSIS — L659 Nonscarring hair loss, unspecified: Secondary | ICD-10-CM

## 2020-02-10 DIAGNOSIS — R635 Abnormal weight gain: Secondary | ICD-10-CM

## 2020-02-10 DIAGNOSIS — Z01419 Encounter for gynecological examination (general) (routine) without abnormal findings: Secondary | ICD-10-CM

## 2020-02-10 DIAGNOSIS — Z13220 Encounter for screening for lipoid disorders: Secondary | ICD-10-CM

## 2020-02-11 LAB — CBC WITH DIFFERENTIAL/PLATELET
Absolute Monocytes: 525 cells/uL (ref 200–950)
Basophils Absolute: 51 cells/uL (ref 0–200)
Basophils Relative: 1 %
Eosinophils Absolute: 122 cells/uL (ref 15–500)
Eosinophils Relative: 2.4 %
HCT: 41.4 % (ref 35.0–45.0)
Hemoglobin: 13.9 g/dL (ref 11.7–15.5)
Lymphs Abs: 1760 cells/uL (ref 850–3900)
MCH: 29.4 pg (ref 27.0–33.0)
MCHC: 33.6 g/dL (ref 32.0–36.0)
MCV: 87.5 fL (ref 80.0–100.0)
MPV: 11.6 fL (ref 7.5–12.5)
Monocytes Relative: 10.3 %
Neutro Abs: 2642 cells/uL (ref 1500–7800)
Neutrophils Relative %: 51.8 %
Platelets: 228 10*3/uL (ref 140–400)
RBC: 4.73 10*6/uL (ref 3.80–5.10)
RDW: 13.1 % (ref 11.0–15.0)
Total Lymphocyte: 34.5 %
WBC: 5.1 10*3/uL (ref 3.8–10.8)

## 2020-02-11 LAB — COMPREHENSIVE METABOLIC PANEL
AG Ratio: 1.9 (calc) (ref 1.0–2.5)
ALT: 9 U/L (ref 6–29)
AST: 12 U/L (ref 10–35)
Albumin: 4.2 g/dL (ref 3.6–5.1)
Alkaline phosphatase (APISO): 92 U/L (ref 37–153)
BUN: 13 mg/dL (ref 7–25)
CO2: 26 mmol/L (ref 20–32)
Calcium: 9.8 mg/dL (ref 8.6–10.4)
Chloride: 106 mmol/L (ref 98–110)
Creat: 0.81 mg/dL (ref 0.50–0.99)
Globulin: 2.2 g/dL (calc) (ref 1.9–3.7)
Glucose, Bld: 83 mg/dL (ref 65–99)
Potassium: 4.4 mmol/L (ref 3.5–5.3)
Sodium: 141 mmol/L (ref 135–146)
Total Bilirubin: 0.7 mg/dL (ref 0.2–1.2)
Total Protein: 6.4 g/dL (ref 6.1–8.1)

## 2020-02-11 LAB — LIPID PANEL
Cholesterol: 214 mg/dL — ABNORMAL HIGH (ref <200)
HDL: 64 mg/dL (ref >=50)
LDL Cholesterol (Calc): 129 mg/dL (calc) — ABNORMAL HIGH
Non-HDL Cholesterol (Calc): 150 mg/dL (calc) — ABNORMAL HIGH (ref <130)
Total CHOL/HDL Ratio: 3.3 (calc) (ref <5.0)
Triglycerides: 104 mg/dL (ref <150)

## 2020-02-11 LAB — THYROID ANTIBODIES
Thyroglobulin Ab: <1 IU/mL (ref <=1)
Thyroperoxidase Ab SerPl-aCnc: 2 IU/mL (ref <9)

## 2020-02-11 LAB — T4
Free Thyroxine Index: 2.7 (ref 1.4–3.8)
T4, Total: 8.5 ug/dL (ref 5.1–11.9)

## 2020-02-11 LAB — T3 UPTAKE: T3 Uptake: 32 % (ref 22–35)

## 2020-02-11 LAB — TSH: TSH: 0.72 mIU/L (ref 0.40–4.50)

## 2020-02-26 NOTE — Telephone Encounter (Signed)
Patient informed with unread my chart message.

## 2020-07-13 ENCOUNTER — Other Ambulatory Visit: Payer: Self-pay

## 2020-07-13 ENCOUNTER — Encounter: Payer: Self-pay | Admitting: Nurse Practitioner

## 2020-07-13 ENCOUNTER — Ambulatory Visit (INDEPENDENT_AMBULATORY_CARE_PROVIDER_SITE_OTHER): Payer: 59 | Admitting: Nurse Practitioner

## 2020-07-13 VITALS — BP 104/68 | Ht 62.0 in | Wt 143.0 lb

## 2020-07-13 DIAGNOSIS — Z01419 Encounter for gynecological examination (general) (routine) without abnormal findings: Secondary | ICD-10-CM | POA: Diagnosis not present

## 2020-07-13 DIAGNOSIS — M8589 Other specified disorders of bone density and structure, multiple sites: Secondary | ICD-10-CM | POA: Diagnosis not present

## 2020-07-13 NOTE — Progress Notes (Signed)
   Michele [name] 27-Jan-1956 [id number]   History:  64 y.o. G2P1011 presents for annual exam without GYN complaints. Postmenopausal - no HRT, no bleeding. Normal pap and mammogram history. History of pelvic mass, migraines, and osteopenia. 2016 left oophorectomy with BSO for left ovarian cyst.   Gynecologic History No LMP recorded. Patient is postmenopausal.   Last Pap: 07/10/2019. Results were: normal Last mammogram: 11/28/2019. Results were: normal Last colonoscopy: 10/10/2012. Results were: normal Last Dexa: 09/18/2018. Results were: t-score -1.9, FRAX 10% / 1.3%  Past medical history, past surgical history, family history and social history were all reviewed and documented in the EPIC chart.  ROS:  A ROS was performed and pertinent positives and negatives are included.  Exam:  Vitals:   07/13/20 1418  BP: 104/68  Weight: 143 lb (64.9 kg)  Height: 5\' 2"  (1.575 m)   Body mass index is 26.16 kg/m.  General appearance:  Normal Thyroid:  Symmetrical, normal in size, without palpable masses or nodularity. Respiratory  Auscultation:  Clear without wheezing or rhonchi Cardiovascular  Auscultation:  Regular rate, without rubs, murmurs or gallops  Edema/varicosities:  Not grossly evident Abdominal  Soft,nontender, without masses, guarding or rebound.  Liver/spleen:  No organomegaly noted  Hernia:  None appreciated  Skin  Inspection:  Grossly normal   Breasts: Examined lying and sitting.   Right: Without masses, retractions, discharge or axillary adenopathy.   Left: Without masses, retractions, discharge or axillary adenopathy. Gentitourinary   Inguinal/mons:  Normal without inguinal adenopathy  External genitalia:  Normal  BUS/Urethra/Skene's glands:  Normal  Vagina:  Normal, mild atrophic changes  Cervix:  Normal  Uterus:  Anteverted, normal in size, shape and contour.  Midline and mobile  Adnexa/parametria:     Rt: Without masses or tenderness.   Lt: Without masses or  tenderness.  Anus and perineum: Normal  Digital rectal exam: Normal sphincter tone without palpated masses or tenderness  Assessment/Plan:  64 y.o. G for annual exam.   Well female exam with routine gynecological exam - Education provided on SBEs, importance of preventative screenings, current guidelines, high calcium diet, regular exercise, and multivitamin daily.   Osteopenia of multiple sites - continue daily calcium + vitamin D supplement. Home safety and fall prevention, weightbearing exercises.  Follow up in 1 year for annual       [place], 2:38 PM 07/13/2020

## 2020-07-13 NOTE — Patient Instructions (Signed)

## 2021-03-11 ENCOUNTER — Other Ambulatory Visit: Payer: Self-pay

## 2021-03-11 DIAGNOSIS — M8580 Other specified disorders of bone density and structure, unspecified site: Secondary | ICD-10-CM

## 2021-04-27 ENCOUNTER — Other Ambulatory Visit: Payer: Self-pay

## 2021-04-27 ENCOUNTER — Ambulatory Visit (INDEPENDENT_AMBULATORY_CARE_PROVIDER_SITE_OTHER): Payer: Medicare Other

## 2021-04-27 ENCOUNTER — Other Ambulatory Visit: Payer: Self-pay | Admitting: Nurse Practitioner

## 2021-04-27 DIAGNOSIS — M810 Age-related osteoporosis without current pathological fracture: Secondary | ICD-10-CM

## 2021-04-27 DIAGNOSIS — Z780 Asymptomatic menopausal state: Secondary | ICD-10-CM | POA: Diagnosis not present

## 2021-04-27 DIAGNOSIS — M8589 Other specified disorders of bone density and structure, multiple sites: Secondary | ICD-10-CM

## 2021-04-27 DIAGNOSIS — M8580 Other specified disorders of bone density and structure, unspecified site: Secondary | ICD-10-CM

## 2021-05-07 ENCOUNTER — Telehealth: Payer: Self-pay | Admitting: *Deleted

## 2021-05-07 NOTE — Telephone Encounter (Addendum)
Left message for pt to return my call. Pt received results and will make a visit to discuss Dexa with Provider.

## 2021-06-04 ENCOUNTER — Ambulatory Visit (INDEPENDENT_AMBULATORY_CARE_PROVIDER_SITE_OTHER): Payer: Medicare Other | Admitting: Obstetrics & Gynecology

## 2021-06-04 ENCOUNTER — Telehealth: Payer: Self-pay | Admitting: *Deleted

## 2021-06-04 ENCOUNTER — Other Ambulatory Visit: Payer: Self-pay

## 2021-06-04 ENCOUNTER — Encounter: Payer: Self-pay | Admitting: Obstetrics & Gynecology

## 2021-06-04 VITALS — BP 122/78

## 2021-06-04 DIAGNOSIS — M810 Age-related osteoporosis without current pathological fracture: Secondary | ICD-10-CM

## 2021-06-04 NOTE — Telephone Encounter (Signed)
Per Dr. Dellis Filbert note start Reclast  Age-related osteoporosis without current pathological fracture  Significance of osteoporosis discussed with patient.  Bone density results thoroughly reviewed.  Because of strong family history of her osteoporosis with side effects on Fosamax and Prolia and because patient has GERD herself, decision to start on Reclast after counseling.  Usage, benefits and risks of Reclast reviewed.  Patient informed that a creatinine and a calcium level would be done prior to starting Reclast.

## 2021-06-04 NOTE — Telephone Encounter (Addendum)
Left message for pt to call me back   Recast instructions  Annual Exam (1 year) 07/13/2020 ML  Called pt to verify insurance coverage   Labs must be in 30 days window Serum Creatinine 0.7 DATE 06/07/2021 Serum Calcium 9.9   DATE 06/07/2021   PA needed? No  for primary or secondary insurance  Reference [id number]  Cost for Pt? $0 ( has met medicare ded as well as secondary will pay 20% co-insurance)  Order form filled out and faxed  w/MD sig? yes  Infusion will be done at ? [place]  Pt aware ? yes  Instructions mailed to pt? Yes   Appt 06/28/2021

## 2021-06-04 NOTE — Progress Notes (Signed)
    Michele [name] Feb 03, 1956 [id number]        65 y.o.  G2P0011   RP: Osteoporosis for counseling and management  HPI: Postmenopausal on no hormone replacement therapy.  Patient taking vitamin D supplements with calcium.  Active, but could increase her weightbearing physical activities.  No problem with balance.  No recent fall.  No current fracture.  Patient has severe GERD.  Strong family h/o Osteoporosis.  Many family members with severe side effects to Fosamax and Prolia.   OB History  Gravida Para Term Preterm AB Living  2 1     1 1   SAB IAB Ectopic Multiple Live Births      1        # Outcome Date GA Lbr Len/2nd Weight Sex Delivery Anes PTL Lv  2 Ectopic           1 Para             Past medical history,surgical history, problem list, medications, allergies, family history and social history were all reviewed and documented in the EPIC chart.   Directed ROS with pertinent positives and negatives documented in the history of present illness/assessment and plan.  Exam:  Vitals:   06/04/21 1032  BP: 122/78   General appearance:  Normal  Bone Density Apr 27, 2021:  Osteoporosis with T-Score of -2.5 at the AP Spine.  Significant decrease in bone density at the AP spine and the right total hip.    Assessment/Plan:  65 y.o. G2P0011   1. Age-related osteoporosis without current pathological fracture  Significance of osteoporosis discussed with patient.  Bone density results thoroughly reviewed.  Because of strong family history of her osteoporosis with side effects on Fosamax and Prolia and because patient has GERD herself, decision to start on Reclast after counseling.  Usage, benefits and risks of Reclast reviewed.  Patient informed that a creatinine and a calcium level would be done prior to starting Reclast.     Princess Bruins MD, 11:13 AM 06/04/2021

## 2021-06-07 ENCOUNTER — Other Ambulatory Visit: Payer: Self-pay

## 2021-06-07 ENCOUNTER — Ambulatory Visit: Payer: Medicare Other | Admitting: Obstetrics & Gynecology

## 2021-06-07 ENCOUNTER — Other Ambulatory Visit: Payer: Medicare Other

## 2021-06-07 DIAGNOSIS — M810 Age-related osteoporosis without current pathological fracture: Secondary | ICD-10-CM

## 2021-06-07 LAB — CALCIUM: Calcium: 9.9 mg/dL (ref 8.6–10.4)

## 2021-06-07 LAB — CREATININE, SERUM: Creat: 0.79 mg/dL (ref 0.50–0.99)

## 2021-06-25 ENCOUNTER — Other Ambulatory Visit (HOSPITAL_COMMUNITY): Payer: Self-pay | Admitting: *Deleted

## 2021-06-28 ENCOUNTER — Ambulatory Visit (HOSPITAL_COMMUNITY)
Admission: RE | Admit: 2021-06-28 | Discharge: 2021-06-28 | Disposition: A | Discharge status: Home / Self Care | Payer: Medicare Other | Source: Ambulatory Visit | Attending: Obstetrics & Gynecology | Admitting: Obstetrics & Gynecology

## 2021-06-28 DIAGNOSIS — M810 Age-related osteoporosis without current pathological fracture: Secondary | ICD-10-CM | POA: Diagnosis not present

## 2021-06-28 MED ORDER — ZOLEDRONIC ACID 5 MG/100ML IV SOLN
INTRAVENOUS | Status: AC
  Administered 2021-06-28: 5 mg via INTRAVENOUS
  Filled 2021-06-28: qty 100

## 2021-06-28 MED ORDER — ZOLEDRONIC ACID 5 MG/100ML IV SOLN: 5.0000 mg | Freq: Once | INTRAVENOUS | Status: AC

## 2021-06-29 ENCOUNTER — Other Ambulatory Visit: Payer: Self-pay

## 2021-06-29 ENCOUNTER — Emergency Department (HOSPITAL_COMMUNITY): Payer: Medicare Other

## 2021-06-29 ENCOUNTER — Emergency Department (HOSPITAL_COMMUNITY)
Admission: EM | Admit: 2021-06-29 | Discharge: 2021-06-30 | Discharge status: Against Medical Advice | Payer: Medicare Other | Attending: Student | Admitting: Student

## 2021-06-29 DIAGNOSIS — R0689 Other abnormalities of breathing: Secondary | ICD-10-CM | POA: Diagnosis not present

## 2021-06-29 DIAGNOSIS — Z5321 Procedure and treatment not carried out due to patient leaving prior to being seen by health care provider: Secondary | ICD-10-CM | POA: Diagnosis not present

## 2021-06-29 DIAGNOSIS — R079 Chest pain, unspecified: Secondary | ICD-10-CM | POA: Diagnosis not present

## 2021-06-29 DIAGNOSIS — R0781 Pleurodynia: Secondary | ICD-10-CM | POA: Diagnosis not present

## 2021-06-29 DIAGNOSIS — R519 Headache, unspecified: Secondary | ICD-10-CM | POA: Diagnosis not present

## 2021-06-29 LAB — CBC WITH DIFFERENTIAL/PLATELET
Abs Immature Granulocytes: 0.03 10*3/uL (ref 0.00–0.07)
Basophils Absolute: 0.1 10*3/uL (ref 0.0–0.1)
Basophils Relative: 1 %
Eosinophils Absolute: 0 10*3/uL (ref 0.0–0.5)
Eosinophils Relative: 1 %
HCT: 40.9 % (ref 36.0–46.0)
Hemoglobin: 13.6 g/dL (ref 12.0–15.0)
Immature Granulocytes: 1 %
Lymphocytes Relative: 6 %
Lymphs Abs: 0.4 10*3/uL — ABNORMAL LOW (ref 0.7–4.0)
MCH: 29.7 pg (ref 26.0–34.0)
MCHC: 33.3 g/dL (ref 30.0–36.0)
MCV: 89.3 fL (ref 80.0–100.0)
Monocytes Absolute: 0.2 10*3/uL (ref 0.1–1.0)
Monocytes Relative: 2 %
Neutro Abs: 5.7 10*3/uL (ref 1.7–7.7)
Neutrophils Relative %: 89 %
Platelets: 225 10*3/uL (ref 150–400)
RBC: 4.58 MIL/uL (ref 3.87–5.11)
RDW: 13.5 % (ref 11.5–15.5)
WBC: 6.4 10*3/uL (ref 4.0–10.5)
nRBC: 0 % (ref 0.0–0.2)

## 2021-06-29 LAB — COMPREHENSIVE METABOLIC PANEL
ALT: 32 U/L (ref 0–44)
AST: 41 U/L (ref 15–41)
Albumin: 3.7 g/dL (ref 3.5–5.0)
Alkaline Phosphatase: 75 U/L (ref 38–126)
Anion gap: 7 (ref 5–15)
BUN: 10 mg/dL (ref 8–23)
CO2: 24 mmol/L (ref 22–32)
Calcium: 8.9 mg/dL (ref 8.9–10.3)
Chloride: 105 mmol/L (ref 98–111)
Creatinine, Ser: 0.82 mg/dL (ref 0.44–1.00)
GFR, Estimated: >60 mL/min (ref >60)
Glucose, Bld: 100 mg/dL — ABNORMAL HIGH (ref 70–99)
Potassium: 3.9 mmol/L (ref 3.5–5.1)
Sodium: 136 mmol/L (ref 135–145)
Total Bilirubin: 0.9 mg/dL (ref 0.3–1.2)
Total Protein: 6.4 g/dL — ABNORMAL LOW (ref 6.5–8.1)

## 2021-06-29 LAB — TROPONIN I (HIGH SENSITIVITY): Troponin I (High Sensitivity): 5 ng/L (ref <18)

## 2021-06-29 NOTE — ED Provider Notes (Signed)
Emergency Medicine Provider Triage Evaluation Note  Michele [name] , a 65 y.o. female  was evaluated in triage.  Pt complains of patient presents with chief complaint of headaches, chest pain, shortness of breath.  Patient states she recent got a Reclast infusion yesterday, has felt horrible ever since, she endorses that every time she takes in a deep breath she has severe chest pain.  Patient has no cardiac history, no history of PEs or DVTs currently not on hormone therapy.  Review of Systems  Positive: Headaches, chest pain Negative: Abdominal, nausea vomiting  Physical Exam  BP (!) 112/59 (BP Location: Left Arm)   Pulse 88   Temp 99.3 F (37.4 C) (Oral)   Resp 18   Ht 5\' 2"  (1.575 m)   Wt 67.1 kg   SpO2 98%   BMI 27.07 kg/m  Gen:   Awake, no distress   Resp:  Normal effort  MSK:   Moves extremities without difficulty  Other:  No facial asymmetry, no unilateral weakness, patient able to follow commands, noted word finding.  Medical Decision Making  Medically screening exam initiated at 8:33 PM.  Appropriate orders placed.  Michele [name] was informed that the remainder of the evaluation will be completed by another provider, this initial triage assessment does not replace that evaluation, and the importance of remaining in the ED until their evaluation is complete.  Patient presents with chest pain, headaches, labwork imaging have been ordered, patient need further work-up in the emergency department.   Marcello Fennel, PA-C 06/29/21 2034    Deno Etienne, DO 06/29/21 2313

## 2021-06-29 NOTE — ED Triage Notes (Signed)
Pt states having a first reclast infusion yesterday. States having chest pain, rib cage soreness, difficulty breathing, chills, and headache two hour post infusion. Has taken tylenol for without improvement. Seen at urgent care, recommend ED visit for liver and kidney function. NO hx pe

## 2021-06-30 ENCOUNTER — Telehealth: Payer: Self-pay

## 2021-06-30 NOTE — ED Notes (Signed)
Pt said she did not want to wait any longer she was going home and to her doctor in the morning.

## 2021-06-30 NOTE — Telephone Encounter (Signed)
Patient called and I spoke with her. She said on 06/28/21 shortly after receiving Reclast IV she began to feel bad. Last evening ( 06/29/21)she had ribcage and back pain so bad that it forced her to shallow breath. She had vomiting and one point passed out. She was seen in ER for labs and imaging but said she was so miserable she left before seeing a physician.  She called today because she wanted to know if her liver and kidney functions looked normal. I did tell her those things were fine.  I encouraged her to not just assume reaction to Reclast and go see PCP to be worked up for this but she said she is better today and does not plan to go.  She was asking about next time getting the Reclast 1 mg. Instead of 5mg  because she had read that the lower dose can be just as therapeutic.

## 2021-06-30 NOTE — Telephone Encounter (Signed)
Patient called in voice mail stating she had "severe reaction to Reclast" and went to the ER yesterday.  ER report shows she had headache and chest pains.  She had labs and imaging and left prior to seeing the MD and getting a diagnosis stating she would see her MD today.  She called our office asking if her labs were ok specifically her liver and kidney functions.  I called her back and left message to call.

## 2021-10-20 NOTE — Telephone Encounter (Signed)
Princess Bruins, MD to Ramond Craver, RMA     7:01 AM I agree with trying the 1 mg dosage of Reclast.  ML   June 30, 2021      2:04 PM Ramond Craver, RMA routed this conversation to Princess Bruins, MD  Ramond Craver, RMA     2:04 PM Note Patient called and I spoke with her. She said on 06/28/21 shortly after receiving Reclast IV she began to feel bad. Last evening ( 06/29/21)she had ribcage and back pain so bad that it forced her to shallow breath. She had vomiting and one point passed out. She was seen in ER for labs and imaging but said she was so miserable she left before seeing a physician.   She called today because she wanted to know if her liver and kidney functions looked normal. I did tell her those things were fine.   I encouraged her to not just assume reaction to Reclast and go see PCP to be worked up for this but she said she is better today and does not plan to go.   She was asking about next time getting the Reclast 1 mg. Instead of 5mg  because she had read that the lower dose can be just as therapeutic.        Ramond Craver, RMA     12:11 PM Note Patient called in voice mail stating she had "severe reaction to Reclast" and went to the ER yesterday.  ER report shows she had headache and chest pains.  She had labs and imaging and left prior to seeing the MD and getting a diagnosis stating she would see her MD today.   She called our office asking if her labs were ok specifically her liver and kidney functions.   I called her back and left message to call.

## 2022-01-05 ENCOUNTER — Encounter: Payer: Self-pay | Admitting: Nurse Practitioner

## 2022-02-22 ENCOUNTER — Ambulatory Visit (INDEPENDENT_AMBULATORY_CARE_PROVIDER_SITE_OTHER): Payer: Medicare Other | Admitting: Podiatry

## 2022-02-22 ENCOUNTER — Ambulatory Visit (INDEPENDENT_AMBULATORY_CARE_PROVIDER_SITE_OTHER): Payer: Medicare Other

## 2022-02-22 ENCOUNTER — Ambulatory Visit: Payer: Medicare Other

## 2022-02-22 ENCOUNTER — Other Ambulatory Visit: Payer: Self-pay

## 2022-02-22 DIAGNOSIS — D3610 Benign neoplasm of peripheral nerves and autonomic nervous system, unspecified: Secondary | ICD-10-CM | POA: Diagnosis not present

## 2022-02-22 DIAGNOSIS — M722 Plantar fascial fibromatosis: Secondary | ICD-10-CM

## 2022-02-22 MED ORDER — CELECOXIB 100 MG PO CAPS: 100.0000 mg | ORAL_CAPSULE | Freq: Two times a day (BID) | ORAL | 1 refills | Status: DC

## 2022-02-22 NOTE — Progress Notes (Signed)
SITUATION ?Reason for Consult: Evaluation for Bilateral Custom Foot Orthoses ?Patient / Caregiver Report: Patient wants to try prefabricated foot orthotics first ? ?OBJECTIVE DATA: ?Patient History / Diagnosis:  ?  ICD-10-CM   ?1. Plantar fasciitis, bilateral  M72.2   ?  ?2. Neuroma  D36.10   ?  ? ? ?Current or Previous Devices: None ? ?Foot Examination: ?Skin presentation:   Intact ?Ulcers & Callousing:   None and no history ?Toe / Foot Deformities:  Prominent met heads ?Weight Bearing Presentation:  Cavus ?Sensation:    Intact ? ?Shoe Size: 6.5 ? ?ORTHOTIC RECOMMENDATION ?Recommended Device: 1x pair of prefabricated functional foot orthotics ? ?GOALS OF ORTHOSES ?- Reduce Pain ?- Prevent Foot Deformity ?- Prevent Progression of Further Foot Deformity ?- Relieve Pressure ?- Improve the Overall Biomechanical Function of the Foot and Lower Extremity. ? ?ACTIONS PERFORMED ?Patient was fit with foot orthotics trimmed to shoe last. Patient tolerated fittign procedure.  ? ?Patient was provided with verbal and written instruction and demonstration regarding donning, doffing, wear, care, proper fit, function, purpose, cleaning, and use of the orthosis and in all related precautions and risks and benefits regarding the orthosis. ? ?Patient was also provided with verbal instruction regarding how to report any failures or malfunctions of the orthosis and necessary follow up care. Patient was also instructed to contact our office regarding any change in status that may affect the function of the orthosis. ? ?Patient demonstrated independence with proper donning, doffing, and fit and verbalized understanding of all instructions. ? ?PLAN: ?Patient is to follow up in one week or as necessary (PRN). All questions were answered and concerns addressed. Plan of care was discussed with and agreed upon by the patient. ? ?

## 2022-02-22 NOTE — Patient Instructions (Signed)
Plantar Fasciitis (Heel Spur Syndrome) with Rehab The plantar fascia is a fibrous, ligament-like, soft-tissue structure that spans the bottom of the foot. Plantar fasciitis is a condition that causes pain in the foot due to inflammation of the tissue. SYMPTOMS  Pain and tenderness on the underneath side of the foot. Pain that worsens with standing or walking. CAUSES  Plantar fasciitis is caused by irritation and injury to the plantar fascia on the underneath side of the foot. Common mechanisms of injury include: Direct trauma to bottom of the foot. Damage to a small nerve that runs under the foot where the main fascia attaches to the heel bone. Stress placed on the plantar fascia due to bone spurs. RISK INCREASES WITH:  Activities that place stress on the plantar fascia (running, jumping, pivoting, or cutting). Poor strength and flexibility. Improperly fitted shoes. Tight calf muscles. Flat feet. Failure to warm-up properly before activity. Obesity. PREVENTION Warm up and stretch properly before activity. Allow for adequate recovery between workouts. Maintain physical fitness: Strength, flexibility, and endurance. Cardiovascular fitness. Maintain a health body weight. Avoid stress on the plantar fascia. Wear properly fitted shoes, including arch supports for individuals who have flat feet.  PROGNOSIS  If treated properly, then the symptoms of plantar fasciitis usually resolve without surgery. However, occasionally surgery is necessary.  RELATED COMPLICATIONS  Recurrent symptoms that may result in a chronic condition. Problems of the lower back that are caused by compensating for the injury, such as limping. Pain or weakness of the foot during push-off following surgery. Chronic inflammation, scarring, and partial or complete fascia tear, occurring more often from repeated injections.  TREATMENT  Treatment initially involves the use of ice and medication to help reduce pain and  inflammation. The use of strengthening and stretching exercises may help reduce pain with activity, especially stretches of the Achilles tendon. These exercises may be performed at home or with a therapist. Your caregiver may recommend that you use heel cups of arch supports to help reduce stress on the plantar fascia. Occasionally, corticosteroid injections are given to reduce inflammation. If symptoms persist for greater than 6 months despite non-surgical (conservative), then surgery may be recommended.   MEDICATION  If pain medication is necessary, then nonsteroidal anti-inflammatory medications, such as aspirin and ibuprofen, or other minor pain relievers, such as acetaminophen, are often recommended. Do not take pain medication within 7 days before surgery. Prescription pain relievers may be given if deemed necessary by your caregiver. Use only as directed and only as much as you need. Corticosteroid injections may be given by your caregiver. These injections should be reserved for the most serious cases, because they may only be given a certain number of times.  HEAT AND COLD Cold treatment (icing) relieves pain and reduces inflammation. Cold treatment should be applied for 10 to 15 minutes every 2 to 3 hours for inflammation and pain and immediately after any activity that aggravates your symptoms. Use ice packs or massage the area with a piece of ice (ice massage). Heat treatment may be used prior to performing the stretching and strengthening activities prescribed by your caregiver, physical therapist, or athletic trainer. Use a heat pack or soak the injury in warm water.  SEEK IMMEDIATE MEDICAL CARE IF: Treatment seems to offer no benefit, or the condition worsens. Any medications produce adverse side effects.  EXERCISES- RANGE OF MOTION (ROM) AND STRETCHING EXERCISES - Plantar Fasciitis (Heel Spur Syndrome) These exercises may help you when beginning to rehabilitate your injury. Your  symptoms may resolve with or without further involvement from your physician, physical therapist or athletic trainer. While completing these exercises, remember:  Restoring tissue flexibility helps normal motion to return to the joints. This allows healthier, less painful movement and activity. An effective stretch should be held for at least 30 seconds. A stretch should never be painful. You should only feel a gentle lengthening or release in the stretched tissue.  RANGE OF MOTION - Toe Extension, Flexion Sit with your right / left leg crossed over your opposite knee. Grasp your toes and gently pull them back toward the top of your foot. You should feel a stretch on the bottom of your toes and/or foot. Hold this stretch for 10 seconds. Now, gently pull your toes toward the bottom of your foot. You should feel a stretch on the top of your toes and or foot. Hold this stretch for 10 seconds. Repeat  times. Complete this stretch 3 times per day.   RANGE OF MOTION - Ankle Dorsiflexion, Active Assisted Remove shoes and sit on a chair that is preferably not on a carpeted surface. Place right / left foot under knee. Extend your opposite leg for support. Keeping your heel down, slide your right / left foot back toward the chair until you feel a stretch at your ankle or calf. If you do not feel a stretch, slide your bottom forward to the edge of the chair, while still keeping your heel down. Hold this stretch for 10 seconds. Repeat 3 times. Complete this stretch 2 times per day.   STRETCH  Gastroc, Standing Place hands on wall. Extend right / left leg, keeping the front knee somewhat bent. Slightly point your toes inward on your back foot. Keeping your right / left heel on the floor and your knee straight, shift your weight toward the wall, not allowing your back to arch. You should feel a gentle stretch in the right / left calf. Hold this position for 10 seconds. Repeat 3 times. Complete this  stretch 2 times per day.  STRETCH  Soleus, Standing Place hands on wall. Extend right / left leg, keeping the other knee somewhat bent. Slightly point your toes inward on your back foot. Keep your right / left heel on the floor, bend your back knee, and slightly shift your weight over the back leg so that you feel a gentle stretch deep in your back calf. Hold this position for 10 seconds. Repeat 3 times. Complete this stretch 2 times per day.  STRETCH  Gastrocsoleus, Standing  Note: This exercise can place a lot of stress on your foot and ankle. Please complete this exercise only if specifically instructed by your caregiver.  Place the ball of your right / left foot on a step, keeping your other foot firmly on the same step. Hold on to the wall or a rail for balance. Slowly lift your other foot, allowing your body weight to press your heel down over the edge of the step. You should feel a stretch in your right / left calf. Hold this position for 10 seconds. Repeat this exercise with a slight bend in your right / left knee. Repeat 3 times. Complete this stretch 2 times per day.   STRENGTHENING EXERCISES - Plantar Fasciitis (Heel Spur Syndrome)  These exercises may help you when beginning to rehabilitate your injury. They may resolve your symptoms with or without further involvement from your physician, physical therapist or athletic trainer. While completing these exercises, remember:  Muscles can  gain both the endurance and the strength needed for everyday activities through controlled exercises. Complete these exercises as instructed by your physician, physical therapist or athletic trainer. Progress the resistance and repetitions only as guided.  STRENGTH - Towel Curls Sit in a chair positioned on a non-carpeted surface. Place your foot on a towel, keeping your heel on the floor. Pull the towel toward your heel by only curling your toes. Keep your heel on the floor. Repeat 3 times.  Complete this exercise 2 times per day.  STRENGTH - Ankle Inversion Secure one end of a rubber exercise band/tubing to a fixed object (table, pole). Loop the other end around your foot just before your toes. Place your fists between your knees. This will focus your strengthening at your ankle. Slowly, pull your big toe up and in, making sure the band/tubing is positioned to resist the entire motion. Hold this position for 10 seconds. Have your muscles resist the band/tubing as it slowly pulls your foot back to the starting position. Repeat 3 times. Complete this exercises 2 times per day.  Document Released: 12/05/2005 Document Revised: 02/27/2012 Document Reviewed: 03/19/2009 [facility] Patient Information 2014 [place], [place].   Celecoxib Capsules What is this medication? CELECOXIB (sell a KOX ib) treats mild to moderate pain, inflammation, or arthritis. It belongs to a group of medications called NSAIDs. This medicine may be used for other purposes; ask your health care provider or pharmacist if you have questions. COMMON BRAND NAME(S): Celebrex What should I tell my care team before I take this medication? They need to know if you have any of these conditions: Bleeding disorders Coronary artery bypass graft (CABG) within the past 2 weeks Heart attack Heart disease Heart failure High blood pressure High levels of potassium in the blood If you often drink alcohol Kidney disease Liver disease Low red blood cell counts Lung or breathing disease (asthma) Receiving steroids like dexamethasone or prednisone Smoke cigarettes Stomach bleeding Stomach or intestine problems Take medications that treat or prevent blood clots An unusual or allergic reaction to celecoxib, other medications, foods, dyes, or preservatives Pregnant or trying to get pregnant Breast-feeding How should I use this medication? Take this medication by mouth with water. Take it as directed on the prescription  label at the same time every day. Do not cut, crush or chew this medication. Swallow the capsules whole. You may open the capsule and put the contents in 1 teaspoon of applesauce. Swallow the medication and applesauce right away. Do not chew the medication or applesauce. Keep taking it unless your care team tells you to stop. A special MedGuide will be given to you by the pharmacist with each prescription and refill. Be sure to read this information carefully each time. Talk to your care team regarding the use of this medication in children. While this medication may be prescribed for children as young as 41 years old for selected conditions, precautions do apply. Patients over 31 years of age may have a stronger reaction and need a smaller dose. Overdosage: If you think you have taken too much of this medicine contact a poison control center or emergency room at once. NOTE: This medicine is only for you. Do not share this medicine with others. What if I miss a dose? If you miss a dose, take it as soon as you can. If it is almost time for your next dose, take only that dose. Do not take double or extra doses. What may interact with this medication? Do  not take this medication with any of the following: Cidofovir Ketorolac Thioridazine This medication may also interact with the following: Alcohol Aspirin and aspirin-like medications Atomoxetine Certain medications for blood pressure, heart disease, irregular heart beat Certain medications for depression, anxiety, or psychotic disorders Certain medications that treat or prevent blood clots like warfarin, enoxaparin, dalteparin, apixaban, dabigatran, and rivaroxaban Cyclosporine Digoxin Diuretics Fluconazole Lithium Methotrexate Other NSAIDs, medications for pain and inflammation, like ibuprofen or naproxen Pemetrexed Rifampin Steroid medications like prednisone or cortisone This list may not describe all possible interactions. Give your  health care provider a list of all the medicines, herbs, non-prescription drugs, or dietary supplements you use. Also tell them if you smoke, drink alcohol, or use illegal drugs. Some items may interact with your medicine. What should I watch for while using this medication? Visit your care team for regular checks on your progress. Tell your care team if your symptoms do not start to get better or if they get worse. Do not take other medications that contain aspirin, ibuprofen, or naproxen with this medication. Side effects such as stomach upset, nausea, or ulcers may be more likely to occur. Many non-prescription medications contain aspirin, ibuprofen, or naproxen. Always read labels carefully. This medication can cause serious ulcers and bleeding in the stomach. It can happen with no warning. Smoking, drinking alcohol, older age, and poor health can also increase risks. Call your care team right away if you have stomach pain or blood in your vomit or stool. This medication does not prevent a heart attack or stroke. This medication may increase the chance of a heart attack or stroke. The chance may increase the longer you use this medication or if you have heart disease. If you take aspirin to prevent a heart attack or stroke, talk to your care team about using this medication. Alcohol may interfere with the effect of this medication. Avoid alcoholic drinks. This medication may cause serious skin reactions. They can happen weeks to months after starting the medication. Contact your care team right away if you notice fevers or flu-like symptoms with a rash. The rash may be red or purple and then turn into blisters or peeling of the skin. Or, you might notice a red rash with swelling of the face, lips or lymph nodes in your neck or under your arms. Talk to your care team if you are pregnant before taking this medication. Taking this medication between weeks 20 and 30 of pregnancy may harm your unborn baby.  Your care team will monitor you closely if you need to take it. After 30 weeks of pregnancy, do not take this medication. You may get drowsy or dizzy. Do not drive, use machinery, or do anything that needs mental alertness until you know how this medication affects you. Do not stand up or sit up quickly, especially if you are an older patient. This reduces the risk of dizzy or fainting spells. Be careful brushing or flossing your teeth or using a toothpick because you may get an infection or bleed more easily. If you have any dental work done, tell your dentist you are receiving this medication. This medication may make it more difficult to get pregnant. Talk to your care team if you are concerned about your fertility. What side effects may I notice from receiving this medication? Side effects that you should report to your care team as soon as possible: Allergic reactions--skin rash, itching, hives, swelling of the face, lips, tongue, or throat Bleeding--bloody or black,  tar-like stools, vomiting blood or brown material that looks like coffee grounds, red or dark brown urine, small red or purple spots on skin, unusual bruising or bleeding Heart attack--pain or tightness in the chest, shoulders, arms, or jaw, nausea, shortness of breath, cold or clammy skin, feeling faint or lightheaded Heart failure--shortness of breath, swelling of ankles, feet, or hands, sudden weight gain, unusual weakness or fatigue Increase in blood pressure Kidney injury--decrease in the amount of urine, swelling of the ankles, hands, or feet Liver injury--right upper belly pain, loss of appetite, nausea, light-colored stool, dark yellow or brown urine, yellowing skin or eyes, unusual weakness or fatigue Rash, fever, and swollen lymph nodes Redness, blistering, peeling, or loosening of the skin, including inside the mouth Stroke--sudden numbness or weakness of the face, arm, or leg, trouble speaking, confusion, trouble walking,  loss of balance or coordination, dizziness, severe headache, change in vision Side effects that usually do not require medical attention (report to your care team if they continue or are bothersome): Headache Loss of appetite Nausea Upset stomach This list may not describe all possible side effects. Call your doctor for medical advice about side effects. You may report side effects to FDA at 1-800-FDA-1088. Where should I keep my medication? Keep out of the reach of children and pets. Store at room temperature between 15 and 30 degrees C (59 and 86 degrees F). Get rid of any unused medication after the expiration date. To get rid of medications that are no longer needed or have expired: Take the medication to a medication take-back program. Check with your pharmacy or law enforcement to find a location. If you cannot return the medication, check the label or package insert to see if the medication should be thrown out in the garbage or flushed down the toilet. If you are not sure, ask your care team. If it is safe to put it in the trash, empty the medication out of the container. Mix the medication with cat litter, dirt, coffee grounds, or other unwanted substance. Seal the mixture in a bag or container. Put it in the trash. NOTE: This sheet is a summary. It may not cover all possible information. If you have questions about this medicine, talk to your doctor, pharmacist, or health care provider.  2022 [employer]vier/Gold Standard (2021-08-24 00:00:00)
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
## 2022-04-13 ENCOUNTER — Ambulatory Visit (INDEPENDENT_AMBULATORY_CARE_PROVIDER_SITE_OTHER): Payer: Medicare Other | Admitting: Internal Medicine

## 2022-04-13 ENCOUNTER — Encounter: Payer: Self-pay | Admitting: Internal Medicine

## 2022-04-13 VITALS — BP 116/76 | HR 78 | Temp 98.1°F | Ht 62.0 in | Wt 147.0 lb

## 2022-04-13 DIAGNOSIS — M722 Plantar fascial fibromatosis: Secondary | ICD-10-CM

## 2022-04-13 DIAGNOSIS — Z1211 Encounter for screening for malignant neoplasm of colon: Secondary | ICD-10-CM

## 2022-04-13 DIAGNOSIS — E785 Hyperlipidemia, unspecified: Secondary | ICD-10-CM | POA: Diagnosis not present

## 2022-04-13 LAB — LIPID PANEL
Cholesterol: 207 mg/dL — ABNORMAL HIGH (ref 0–200)
HDL: 57.9 mg/dL (ref >39.00)
LDL Cholesterol: 122 mg/dL — ABNORMAL HIGH (ref 0–99)
NonHDL: 148.63
Total CHOL/HDL Ratio: 4
Triglycerides: 135 mg/dL (ref 0.0–149.0)
VLDL: 27 mg/dL (ref 0.0–40.0)

## 2022-04-13 LAB — TSH: TSH: 0.79 u[IU]/mL (ref 0.35–5.50)

## 2022-04-13 MED ORDER — MELOXICAM 7.5 MG PO TABS: 7.5000 mg | ORAL_TABLET | Freq: Every day | ORAL | 1 refills | Status: AC

## 2022-04-13 NOTE — Patient Instructions (Signed)
Plantar Fasciitis  Plantar fasciitis is a painful foot condition that affects the heel. It occurs when the band of tissue that connects the toes to the heel bone (plantar fascia) becomes irritated. This can happen as the result of exercising too much or doing other repetitive activities (overuse injury). Plantar fasciitis can cause mild irritation to severe pain that makes it difficult to walk or move. The pain is usually worse in the morning after sleeping, or after sitting or lying down for a period of time. Pain may also be worse after long periods of walking or standing. What are the causes? This condition may be caused by: Standing for long periods of time. Wearing shoes that do not have good arch support. Doing activities that put stress on joints (high-impact activities). This includes ballet and exercise that makes your heart beat faster (aerobic exercise), such as running. Being overweight. An abnormal way of walking (gait). Tight muscles in the back of your lower leg (calf). High arches in your feet or flat feet. Starting a new athletic activity. What are the signs or symptoms? The main symptom of this condition is heel pain. Pain may get worse after the following: Taking the first steps after a time of rest, especially in the morning after awakening, or after you have been sitting or lying down for a while. Long periods of standing still. Pain may decrease after 30-45 minutes of activity, such as gentle walking. How is this diagnosed? This condition may be diagnosed based on your medical history, a physical exam, and your symptoms. Your health care provider will check for: A tender area on the bottom of your foot. A high arch in your foot or flat feet. Pain when you move your foot. Difficulty moving your foot. You may have imaging tests to confirm the diagnosis, such as: X-rays. Ultrasound. MRI. How is this treated? Treatment for plantar fasciitis depends on how severe your  condition is. Treatment may include: Rest, ice, pressure (compression), and raising (elevating) the affected foot. This is called RICE therapy. Your health care provider may recommend RICE therapy along with over-the-counter pain medicines to manage your pain. Exercises to stretch your calves and your plantar fascia. A splint that holds your foot in a stretched, upward position while you sleep (night splint). Physical therapy to relieve symptoms and prevent problems in the future. Injections of steroid medicine (cortisone) to relieve pain and inflammation. Stimulating your plantar fascia with electrical impulses (extracorporeal shock wave therapy). This is usually the last treatment option before surgery. Surgery, if other treatments have not worked after 12 months. Follow these instructions at home: Managing pain, stiffness, and swelling  If directed, put ice on the painful area. To do this: Put ice in a plastic bag, or use a frozen bottle of water. Place a towel between your skin and the bag or bottle. Roll the bottom of your foot over the bag or bottle. Do this for 20 minutes, 2-3 times a day. Wear athletic shoes that have air-sole or gel-sole cushions, or try soft shoe inserts that are designed for plantar fasciitis. Elevate your foot above the level of your heart while you are sitting or lying down. Activity Avoid activities that cause pain. Ask your health care provider what activities are safe for you. Do physical therapy exercises and stretches as told by your health care provider. Try activities and forms of exercise that are easier on your joints (low impact). Examples include swimming, water aerobics, and biking. General instructions Take over-the-counter   and prescription medicines only as told by your health care provider. Wear a night splint while sleeping, if told by your health care provider. Loosen the splint if your toes tingle, become numb, or turn cold and blue. Maintain a  healthy weight, or work with your health care provider to lose weight as needed. Keep all follow-up visits. This is important. Contact a health care provider if you have: Symptoms that do not go away with home treatment. Pain that gets worse. Pain that affects your ability to move or do daily activities. Summary Plantar fasciitis is a painful foot condition that affects the heel. It occurs when the band of tissue that connects the toes to the heel bone (plantar fascia) becomes irritated. Heel pain is the main symptom of this condition. It may get worse after exercising too much or standing still for a long time. Treatment varies, but it usually starts with rest, ice, pressure (compression), and raising (elevating) the affected foot. This is called RICE therapy. Over-the-counter medicines can also be used to manage pain. This information is not intended to replace advice given to you by your health care provider. Make sure you discuss any questions you have with your health care provider. Document Revised: 03/23/2020 Document Reviewed: 03/23/2020 Elsevier Patient Education  2023 Elsevier Inc.  
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
## 2022-06-21 IMAGING — DX DG CHEST 2V
2 series · 2 of 2 positions shown · non-contrast
Comparison: Chest radiograph dated 08/08/2012

CLINICAL DATA: 65-year-old female with chest pain.

EXAM:
CHEST - 2 VIEW

[w chest pa]
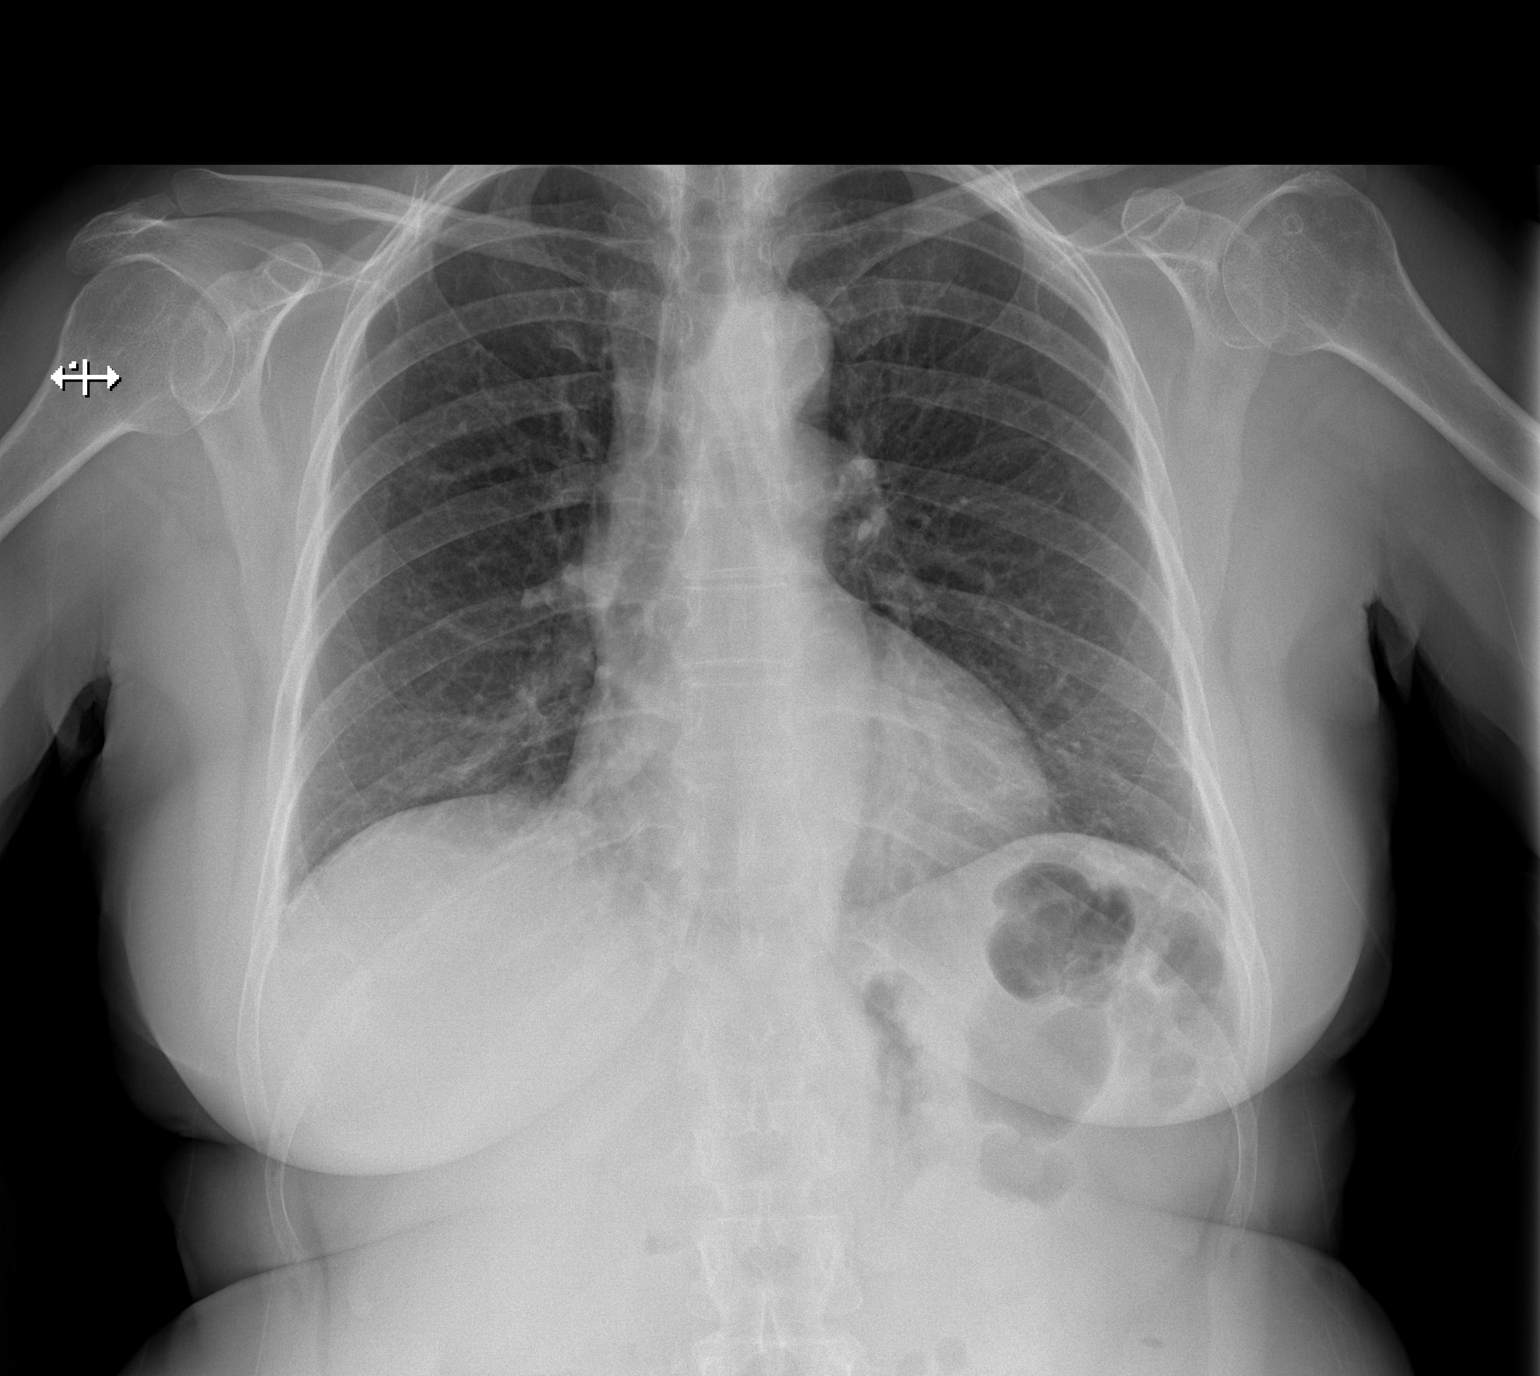

[w chest lat]
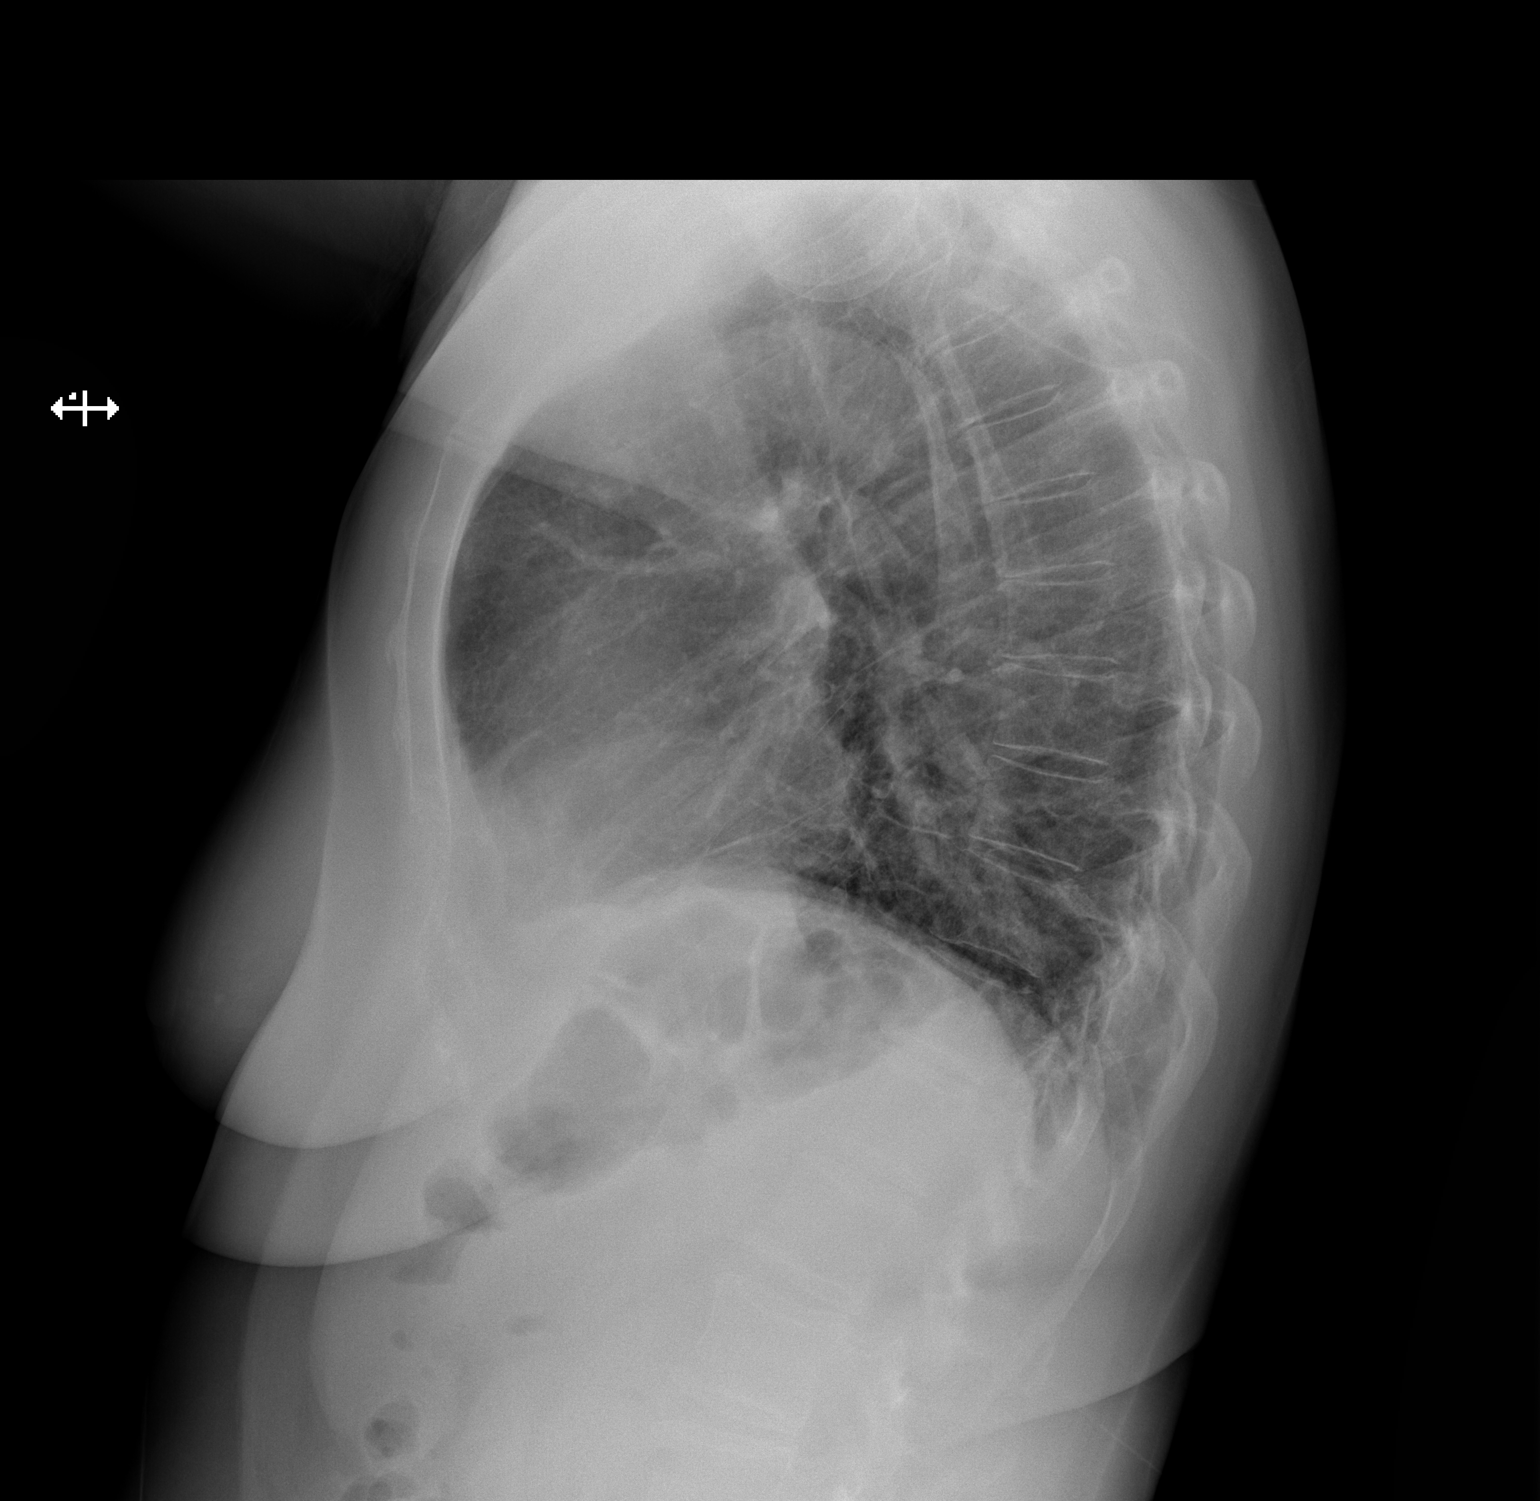

[2 of 2 positions shown; findings below may reference images not displayed]

FINDINGS: The lungs are clear. There is no pleural effusion or pneumothorax.
The cardiac silhouette is within limits. No acute osseous pathology.
IMPRESSION: No active cardiopulmonary disease.

## 2022-06-22 ENCOUNTER — Telehealth: Payer: Self-pay | Admitting: *Deleted

## 2022-06-22 DIAGNOSIS — M810 Age-related osteoporosis without current pathological fracture: Secondary | ICD-10-CM

## 2022-06-22 NOTE — Telephone Encounter (Addendum)
Reclast instructions   Annual Exam (1 year) ? 08/03/2022  Called pt to verify insurance coverage / inform pt Reclast is in Process ? YES  Labs must be in 30 days window  Serum Creatinine  1.03 DATE? 08/30/2022  Serum Calcium 10.5    DATE? 08/30/2022  PA needed NO   Cost for Pt? Reference #[id number] medicare [employer] covered benefit NO PA reference # [id number]/04/2022 Will file 1st   Order form filled out and faxed  w/MD sig?  Infusion will be done at ? [facility]  Appt 09/26/2022  Pt aware? YES  Instructions mailed to pt? YES

## 2022-07-14 ENCOUNTER — Ambulatory Visit: Payer: Medicare Other | Admitting: Nurse Practitioner

## 2022-08-03 ENCOUNTER — Encounter: Payer: Self-pay | Admitting: Nurse Practitioner

## 2022-08-03 ENCOUNTER — Ambulatory Visit (INDEPENDENT_AMBULATORY_CARE_PROVIDER_SITE_OTHER): Payer: Medicare Other | Admitting: Nurse Practitioner

## 2022-08-03 VITALS — BP 104/80 | HR 75 | Ht 62.25 in | Wt 132.0 lb

## 2022-08-03 DIAGNOSIS — N898 Other specified noninflammatory disorders of vagina: Secondary | ICD-10-CM | POA: Diagnosis not present

## 2022-08-03 DIAGNOSIS — Z9189 Other specified personal risk factors, not elsewhere classified: Secondary | ICD-10-CM | POA: Diagnosis not present

## 2022-08-03 DIAGNOSIS — Z01419 Encounter for gynecological examination (general) (routine) without abnormal findings: Secondary | ICD-10-CM

## 2022-08-03 DIAGNOSIS — Z780 Asymptomatic menopausal state: Secondary | ICD-10-CM | POA: Diagnosis not present

## 2022-08-03 DIAGNOSIS — M810 Age-related osteoporosis without current pathological fracture: Secondary | ICD-10-CM | POA: Diagnosis not present

## 2022-08-03 LAB — WET PREP FOR TRICH, YEAST, CLUE

## 2022-08-03 MED ORDER — METRONIDAZOLE 0.75 % VA GEL: 1.0000 | Freq: Every day | VAGINAL | 0 refills | Status: AC

## 2022-08-03 NOTE — Progress Notes (Signed)
No   Michele [name] 1956-03-17 [id number]   History:  66 y.o. G2P1011 presents for annual exam. Complains of vaginal odor without discharge or itching. Postmenopausal - no HRT, no bleeding. Normal pap and mammogram history. History of pelvic mass, migraines, and osteoporosis - on Reclast. S/P 2016 left oophorectomy with bilateral salpingectomies for 5 cm left ovarian cyst. Down 20 pounds since visit last year with diet and exercise.   Gynecologic History No LMP recorded. Patient is postmenopausal.   Sexually active: No  Health maintenance Last Pap: 07/10/2019. Results were: Normal Last mammogram: 01/05/2022. Results were: Normal Last colonoscopy: 10/10/2012. Results were: Normal Last Dexa: 04/27/2021. Results were: T-score -2.5  Past medical history, past surgical history, family history and social history were all reviewed and documented in the EPIC chart. Engaged. 42 yo son. Semi-retired in 2021, has cleaning business now.   ROS:  A ROS was performed and pertinent positives and negatives are included.  Exam:  Vitals:   08/03/22 1122  BP: 104/80  Pulse: 75  SpO2: 97%  Weight: 132 lb (59.9 kg)  Height: 5' 2.25" (1.581 m)    Body mass index is 23.95 kg/m.  General appearance:  Normal Thyroid:  Symmetrical, normal in size, without palpable masses or nodularity. Respiratory  Auscultation:  Clear without wheezing or rhonchi Cardiovascular  Auscultation:  Regular rate, without rubs, murmurs or gallops  Edema/varicosities:  Not grossly evident Abdominal  Soft,nontender, without masses, guarding or rebound.  Liver/spleen:  No organomegaly noted  Hernia:  None appreciated  Skin  Inspection:  Grossly normal Breasts: Examined lying and sitting.   Right: Without masses, retractions, discharge or axillary adenopathy.   Left: Without masses, retractions, discharge or axillary adenopathy. Genitourinary   Inguinal/mons:  Normal without inguinal adenopathy  External genitalia:  Normal  appearing vulva with no masses, tenderness, or lesions  BUS/Urethra/Skene's glands:  Normal  Vagina:  Discharge present, mild odor. Atrophic changes  Cervix:  Normal appearing without discharge or lesions  Uterus:  Normal in size, shape and contour.  Midline and mobile, nontender  Adnexa/parametria:     Rt: Normal in size, without masses or tenderness.   Lt: Normal in size, without masses or tenderness.  Anus and perineum: Normal  Digital rectal exam: Normal sphincter tone without palpated masses or tenderness  Patient informed chaperone available to be present for breast and pelvic exam. Patient has requested no chaperone to be present. Patient has been advised what will be completed during breast and pelvic exam.   Wet prep negative for yeast, clue cells, trich. Many bacteria and WBCs  Assessment/Plan:  66 y.o. G2P0011 for annual exam.   Encounter for breast and pelvic examination - Education provided on SBEs, importance of preventative screenings, current guidelines, high calcium diet, regular exercise, and multivitamin daily. Labs with PCP.   Postmenopausal - no HRT, no bleeding  Age-related osteoporosis without current pathological fracture - Plan: Calcium, Creatinine. T-score -2.5 in May 2022. On reclast.   Vaginal odor - Plan: WET PREP FOR TRICH, YEAST, CLUE, metroNIDAZOLE (METROGEL) 0.75 % vaginal gel. Wet prep negative for clue cells, yeast and trich but did have many bacteria and WBCs. Discharge and mild odor present. Will treat with Metrogel x 5 nights to see if this helps.   Screening for cervical cancer - Normal Pap history.  No longer screening per guidelines.   Screening for breast cancer - Normal mammogram history.  Continue annual screenings.  Normal breast exam today.  Screening for colon cancer - 2013  colonoscopy. Due this November and plans to schedule soon.  Follow up in 1 year for annual.      Tamela Gammon [facility], 12:09 PM 08/03/2022

## 2022-08-04 LAB — CALCIUM: Calcium: 9.8 mg/dL (ref 8.6–10.4)

## 2022-08-04 LAB — CREATININE, SERUM: Creat: 0.91 mg/dL (ref 0.50–1.05)

## 2022-08-08 ENCOUNTER — Telehealth: Payer: Self-pay

## 2022-08-08 NOTE — Telephone Encounter (Signed)
Patient called for lab results. Informed creatinine and calcium are normal.  She asked about liver function tests. I advised her that test was not ordered. Her sister recently diagnosed with "non-fatty liver disease". She is interested in testing. I did tell her last year her LFT's were normal. I told her perhaps we could see if the lab can on that testing and I will check with Tiffany.

## 2022-08-08 NOTE — Telephone Encounter (Signed)
I checked with Loma Sousa in the lab to see if they would be able to add on CMET or LFT's. She misunderstood what I was asking and was able to add on and went ahead and added on the CMET. I hope that is okay with you. (I was just trying to find out if if could be done.)

## 2022-08-08 NOTE — Telephone Encounter (Signed)
Opened encounter twice. Not needed.

## 2022-08-09 NOTE — Telephone Encounter (Signed)
Patient was advised. She was asking about adding lipid panel and I did explain that these tests best done through PCP for insurance to cover them. She will follow up with PCP for lipid panel but was happy we were running the CMET as she is concerned about her having her sister's same liver condition.

## 2022-08-09 NOTE — Telephone Encounter (Signed)
That's fine. We only checked creatinine and calcium due to osteoporosis treatment. Preventative labs should be done with PCP as insurance may not cover.

## 2022-08-10 LAB — COMPLETE METABOLIC PANEL WITH GFR
AG Ratio: 2.5 (calc) (ref 1.0–2.5)
ALT: 7 U/L (ref 6–29)
AST: 13 U/L (ref 10–35)
Albumin: 4.7 g/dL (ref 3.6–5.1)
Alkaline phosphatase (APISO): 66 U/L (ref 37–153)
BUN: 13 mg/dL (ref 7–25)
CO2: 23 mmol/L (ref 20–32)
Calcium: 9.8 mg/dL (ref 8.6–10.4)
Chloride: 110 mmol/L (ref 98–110)
Creat: 0.91 mg/dL (ref 0.50–1.05)
Globulin: 1.9 g/dL (calc) (ref 1.9–3.7)
Glucose, Bld: 91 mg/dL (ref 65–99)
Potassium: 4.6 mmol/L (ref 3.5–5.3)
Sodium: 146 mmol/L (ref 135–146)
Total Bilirubin: 0.4 mg/dL (ref 0.2–1.2)
Total Protein: 6.6 g/dL (ref 6.1–8.1)
eGFR: 69 mL/min/{1.73_m2} (ref >=60)

## 2022-08-17 NOTE — Telephone Encounter (Signed)
Per last Reclast treatment lower dose to 1 mg. Due to allergic reaction: see telephone note 06/04/2021  Per Nurse at short stay cannot be lowered. Pre treatment available,benadryl, solu medrol prior to infusion. I will route to MD for recommendations on how to proceed to treat osteoporosis.

## 2022-08-18 NOTE — Telephone Encounter (Signed)
Michele Bruins, MD  You 21 hours ago (12:08 PM)    Recommend doing the Benadryl/Solumedrol prep for allergy to Reclast.

## 2022-08-18 NOTE — Telephone Encounter (Signed)
Pt aware of appt and will call me back if that day does not fit

## 2022-08-19 NOTE — Telephone Encounter (Signed)
Patient will call short stay to r/s visit.

## 2022-08-30 ENCOUNTER — Other Ambulatory Visit: Payer: Medicare Other

## 2022-08-30 DIAGNOSIS — M810 Age-related osteoporosis without current pathological fracture: Secondary | ICD-10-CM

## 2022-08-30 LAB — CREATININE, SERUM: Creat: 1.03 mg/dL (ref 0.50–1.05)

## 2022-08-31 ENCOUNTER — Encounter (HOSPITAL_COMMUNITY): Payer: Medicare Other

## 2022-08-31 LAB — CALCIUM: Calcium: 10.5 mg/dL — ABNORMAL HIGH (ref 8.6–10.4)

## 2022-08-31 NOTE — Telephone Encounter (Signed)
Patient will have her Reclast 09/26/2022 Calcium level 10.5   Will route to Provider to proceed with Reclast Infusion

## 2022-09-01 NOTE — Telephone Encounter (Signed)
Yes, please proceed with Reclast. Thanks.

## 2022-09-02 ENCOUNTER — Telehealth: Payer: Self-pay

## 2022-09-02 NOTE — Telephone Encounter (Signed)
While informing pt of recent lab results/recommendations she reported that she is not currently taking calcium alone any more. She reported taking only a multivitamin that had a total of '220mg'$  of calcium. However, she is about to have another reclast injection (reports last was about 2 years ago and she is inquiring if that affects her calcium levels at all and should she be concerned with that? Please advise.

## 2022-09-02 NOTE — Telephone Encounter (Signed)
Patient informed. 

## 2022-09-02 NOTE — Telephone Encounter (Signed)
Left message for patient to call.

## 2022-09-07 NOTE — Telephone Encounter (Signed)
Reclast infusion scheduled for 09/26/22.

## 2022-09-15 ENCOUNTER — Encounter: Payer: Self-pay | Admitting: Gastroenterology

## 2022-09-22 NOTE — Telephone Encounter (Signed)
Pt r/s reclast appt to 09/30/2022.

## 2022-09-23 ENCOUNTER — Encounter: Payer: Self-pay | Admitting: Gastroenterology

## 2022-09-26 ENCOUNTER — Encounter (HOSPITAL_COMMUNITY): Payer: Medicare Other

## 2022-09-28 ENCOUNTER — Other Ambulatory Visit (HOSPITAL_COMMUNITY): Payer: Self-pay | Admitting: *Deleted

## 2022-09-29 ENCOUNTER — Other Ambulatory Visit: Payer: Self-pay | Admitting: Obstetrics & Gynecology

## 2022-09-29 ENCOUNTER — Ambulatory Visit (HOSPITAL_COMMUNITY)
Admission: RE | Admit: 2022-09-29 | Discharge: 2022-09-29 | Disposition: A | Discharge status: Home / Self Care | Payer: Medicare Other | Source: Ambulatory Visit | Attending: Obstetrics & Gynecology | Admitting: Obstetrics & Gynecology

## 2022-09-29 DIAGNOSIS — M810 Age-related osteoporosis without current pathological fracture: Secondary | ICD-10-CM | POA: Insufficient documentation

## 2022-09-29 MED ORDER — DEXAMETHASONE SODIUM PHOSPHATE 10 MG/ML IJ SOLN: 10.0000 mg | Freq: Once | INTRAMUSCULAR | Status: AC

## 2022-09-29 MED ORDER — DIPHENHYDRAMINE HCL 50 MG/ML IJ SOLN: 50.0000 mg | Freq: Once | INTRAMUSCULAR | Status: AC

## 2022-09-29 MED ORDER — DEXAMETHASONE SODIUM PHOSPHATE 10 MG/ML IJ SOLN
10.0000 mg | Freq: Once | INTRAMUSCULAR | Status: AC
  Administered 2022-09-29: 10 mg via INTRAVENOUS
  Filled 2022-09-29: qty 1

## 2022-09-29 MED ORDER — DIPHENHYDRAMINE HCL 50 MG/ML IJ SOLN
INTRAMUSCULAR | Status: AC
  Administered 2022-09-29: 50 mg via INTRAVENOUS
  Filled 2022-09-29: qty 1

## 2022-09-29 MED ORDER — ZOLEDRONIC ACID 5 MG/100ML IV SOLN: 5.0000 mg | Freq: Once | INTRAVENOUS | Status: AC

## 2022-09-29 MED ORDER — ZOLEDRONIC ACID 5 MG/100ML IV SOLN
INTRAVENOUS | Status: AC
  Administered 2022-09-29: 5 mg via INTRAVENOUS
  Filled 2022-09-29: qty 100

## 2022-09-29 NOTE — Progress Notes (Signed)
Pt states that she has had Reclast in the past and had an allergic reaction of breathing issues, rib pain, N/V  and went to the emergency room with her previous Reclast infusion.  Dr Dellis Filbert noted that she recommends a benadryl/solumedrol prep but nothing was ordered.  Office called for orders.

## 2022-09-30 ENCOUNTER — Encounter (HOSPITAL_COMMUNITY): Payer: Medicare Other

## 2022-10-04 NOTE — Telephone Encounter (Signed)
Pt had reclast done on 09/29/22.

## 2022-10-18 ENCOUNTER — Other Ambulatory Visit: Payer: Medicare Other

## 2022-10-18 DIAGNOSIS — M810 Age-related osteoporosis without current pathological fracture: Secondary | ICD-10-CM

## 2022-10-19 LAB — CREATININE, SERUM: Creat: 0.86 mg/dL (ref 0.50–1.05)

## 2022-10-19 LAB — CALCIUM: Calcium: 9.3 mg/dL (ref 8.6–10.4)

## 2022-10-25 ENCOUNTER — Telehealth: Payer: Self-pay | Admitting: *Deleted

## 2022-10-25 ENCOUNTER — Ambulatory Visit: Payer: Medicare Other

## 2022-10-25 ENCOUNTER — Encounter: Payer: Self-pay | Admitting: Gastroenterology

## 2022-10-25 NOTE — Telephone Encounter (Signed)
Patient has been rescheduled for pv and procedure. PV is 11/21/22 at 10:00 am in person and procedure is set for 12/21/22 at 10:30 am.

## 2022-10-25 NOTE — Telephone Encounter (Signed)
1302- attempted to reach patient, no answer.  LMOM to call back  1311- attempted to reach pt again, no answer.  LMOM to call office back by 5:00 pm to reschedule PV or we would have to cancel colonoscopy on 11-01-22

## 2022-10-25 NOTE — Progress Notes (Signed)
Phone note created in error. 

## 2022-11-01 ENCOUNTER — Encounter: Payer: Medicare Other | Admitting: Gastroenterology

## 2022-11-24 ENCOUNTER — Telehealth: Payer: Self-pay | Admitting: Internal Medicine

## 2022-11-24 NOTE — Telephone Encounter (Signed)
Left message for patient to call back to schedule Medicare Annual Wellness Visit   No hx of AWV eligible as of 02/16/22  Please schedule at anytime with [facility] Advisor if patient calls the office back.     Any questions, please call me at [phone]

## 2022-12-05 ENCOUNTER — Ambulatory Visit (AMBULATORY_SURGERY_CENTER): Payer: Self-pay

## 2022-12-05 VITALS — Ht 62.0 in | Wt 125.0 lb

## 2022-12-05 DIAGNOSIS — Z1211 Encounter for screening for malignant neoplasm of colon: Secondary | ICD-10-CM

## 2022-12-05 MED ORDER — NA SULFATE-K SULFATE-MG SULF 17.5-3.13-1.6 GM/177ML PO SOLN: 1.0000 | Freq: Once | ORAL | 0 refills | Status: AC

## 2022-12-05 NOTE — Progress Notes (Signed)
Pre visit completed via phone call; Patient verified name, DOB, and address;  No egg or soy allergy known to patient; No issues known to pt with past sedation with any surgeries or procedures; Patient denies ever being told they had issues or difficulty with intubation;  No FH of Malignant Hyperthermia; Pt is not on diet pills; Pt is not on home 02;  Pt is not on blood thinners;  Pt denies issues with constipation;  No A fib or A flutter; Have any cardiac testing pending--NO Pt instructed to use Singlecare.[email or web address] or GoodRx for a price reduction on prep;   Insurance verified during [place] appt=Medicare A/B  Patient's chart reviewed by Osvaldo Angst CNRA prior to previsit and patient appropriate for the [place].  Previsit completed and red dot placed by patient's name on their procedure day (on provider's schedule).    Patient reports she has a GoodRx card;

## 2022-12-21 ENCOUNTER — Encounter: Payer: Self-pay | Admitting: Gastroenterology

## 2022-12-21 ENCOUNTER — Ambulatory Visit (AMBULATORY_SURGERY_CENTER): Payer: Medicare Other | Admitting: Gastroenterology

## 2022-12-21 VITALS — BP 127/63 | HR 60 | Temp 98.0°F | Resp 14 | Ht 62.25 in | Wt 125.0 lb

## 2022-12-21 DIAGNOSIS — Z1211 Encounter for screening for malignant neoplasm of colon: Secondary | ICD-10-CM

## 2022-12-21 DIAGNOSIS — Z800 Family history of malignant neoplasm of digestive organs: Secondary | ICD-10-CM

## 2022-12-21 MED ORDER — SODIUM CHLORIDE 0.9 % IV SOLN: 500.0000 mL | Freq: Once | INTRAVENOUS | Status: DC
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
## 2022-12-21 NOTE — Progress Notes (Signed)
A and O x3. Report to RN. Tolerated MAC anesthesia well. 

## 2022-12-21 NOTE — Patient Instructions (Signed)
Discharge instructions given. Handouts on Diverticulosis and Hemorrhoids. Resume previous medications. YOU HAD AN ENDOSCOPIC PROCEDURE TODAY AT THE [facility]:   Refer to the procedure report that was given to you for any specific questions about what was found during the examination.  If the procedure report does not answer your questions, please call your gastroenterologist to clarify.  If you requested that your care partner not be given the details of your procedure findings, then the procedure report has been included in a sealed envelope for you to review at your convenience later.  YOU SHOULD EXPECT: Some feelings of bloating in the abdomen. Passage of more gas than usual.  Walking can help get rid of the air that was put into your GI tract during the procedure and reduce the bloating. If you had a lower endoscopy (such as a colonoscopy or flexible sigmoidoscopy) you may notice spotting of blood in your stool or on the toilet paper. If you underwent a bowel prep for your procedure, you may not have a normal bowel movement for a few days.  Please Note:  You might notice some irritation and congestion in your nose or some drainage.  This is from the oxygen used during your procedure.  There is no need for concern and it should clear up in a day or so.  SYMPTOMS TO REPORT IMMEDIATELY:  Following lower endoscopy (colonoscopy or flexible sigmoidoscopy):  Excessive amounts of blood in the stool  Significant tenderness or worsening of abdominal pains  Swelling of the abdomen that is new, acute  Fever of 100F or higher  For urgent or emergent issues, a gastroenterologist can be reached at any hour by calling [phone]. Do not use MyChart messaging for urgent concerns.    DIET:  We do recommend a small meal at first, but then you may proceed to your regular diet.  Drink plenty of fluids but you should avoid alcoholic beverages for 24 hours.  ACTIVITY:  You should plan to take  it easy for the rest of today and you should NOT DRIVE or use heavy machinery until tomorrow (because of the sedation medicines used during the test).    FOLLOW UP: Our staff will call the number listed on your records the next business day following your procedure.  We will call around 7:15- 8:00 am to check on you and address any questions or concerns that you may have regarding the information given to you following your procedure. If we do not reach you, we will leave a message.     If any biopsies were taken you will be contacted by phone or by letter within the next 1-3 weeks.  Please call us at [phone] if you have not heard about the biopsies in 3 weeks.    SIGNATURES/CONFIDENTIALITY: You and/or your care partner have signed paperwork which will be entered into your electronic medical record.  These signatures attest to the fact that that the information above on your After Visit Summary has been reviewed and is understood.  Full responsibility of the confidentiality of this discharge information lies with you and/or your care-partner.  

## 2022-12-21 NOTE — Op Note (Signed)
[facility] Patient Name: Michele [name] Procedure Date: 12/21/2022 10:53 AM MRN: [id number] Endoscopist: Justice Britain , MD, [id number] Age: 67 Referring MD:  Date of Birth: 03-14-1956 Gender: Female Account #: [id number] Procedure:                Colonoscopy Indications:              Screening for colon cancer: Family history of                            colorectal cancer in multiple 2nd degree relatives Medicines:                Monitored Anesthesia Care Procedure:                Pre-Anesthesia Assessment:                           - Prior to the procedure, a History and Physical                            was performed, and patient medications and                            allergies were reviewed. The patient's tolerance of                            previous anesthesia was also reviewed. The risks                            and benefits of the procedure and the sedation                            options and risks were discussed with the patient.                            All questions were answered, and informed consent                            was obtained. Prior Anticoagulants: The patient has                            taken no anticoagulant or antiplatelet agents. ASA                            Grade Assessment: II - A patient with mild systemic                            disease. After reviewing the risks and benefits,                            the patient was deemed in satisfactory condition to                            undergo the procedure.  After obtaining informed consent, the colonoscope                            was passed under direct vision. Throughout the                            procedure, the patient's blood pressure, pulse, and                            oxygen saturations were monitored continuously. The                            Olympus PCF-H190DL (#[id number]) Colonoscope was                            introduced  through the anus and advanced to the 3                            cm into the ileum. The colonoscopy was performed                            without difficulty. The patient tolerated the                            procedure. The quality of the bowel preparation was                            adequate. The terminal ileum, ileocecal valve,                            appendiceal orifice, and rectum were photographed. Scope In: 11:04:07 AM Scope Out: 11:16:40 AM Scope Withdrawal Time: 0 hours 9 minutes 13 seconds  Total Procedure Duration: 0 hours 12 minutes 33 seconds  Findings:                 Skin tags were found on perianal exam.                           The digital rectal exam findings include                            hemorrhoids. Pertinent negatives include no                            palpable rectal lesions.                           A moderate amount of semi-liquid stool was found in                            the entire colon, interfering with visualization.                            Lavage of the area was performed using copious  amounts, resulting in clearance with adequate                            visualization.                           The terminal ileum and ileocecal valve appeared                            normal.                           Multiple small-mouthed diverticula were found in                            the recto-sigmoid colon, sigmoid colon, descending                            colon and transverse colon.                           Normal mucosa was found in the entire colon.                           Non-bleeding non-thrombosed external and internal                            hemorrhoids were found during retroflexion, during                            perianal exam and during digital exam. The                            hemorrhoids were Grade II (internal hemorrhoids                            that prolapse but reduce  spontaneously). Complications:            No immediate complications. Estimated Blood Loss:     Estimated blood loss was minimal. Estimated blood                            loss: none. Impression:               - Perianal skin tags found on perianal exam.                            Hemorrhoids found on digital rectal exam.                           - Stool in the entire examined colon - lavaged with                            adequate visualization.                           - The examined portion of the  ileum was normal.                           - Diverticulosis in the recto-sigmoid colon, in the                            sigmoid colon, in the descending colon and in the                            transverse colon.                           - Normal mucosa in the entire examined colon                            otherwise.                           - Non-bleeding non-thrombosed external and internal                            hemorrhoids. Recommendation:           - The patient will be observed post-procedure,                            until all discharge criteria are met.                           - Discharge patient to home.                           - Patient has a contact number available for                            emergencies. The signs and symptoms of potential                            delayed complications were discussed with the                            patient. Return to normal activities tomorrow.                            Written discharge instructions were provided to the                            patient.                           - High fiber diet.                           - Use FiberCon 1-2 tablets PO daily.                           - Continue present medications.                           -  Repeat colonoscopy in 5-10 years for screening                            purposes. Patients with multiple 2nd degree                            relatives may be at  increased risk for colon                            cancer, so could consider a 5-year followup,                            however, she already went 10-years between                            colonoscopies and they were both noted to be                            normal, so this can be considered by patient in                            future if just to remain at 10-years.                           - The findings and recommendations were discussed                            with the patient.                           - The findings and recommendations were discussed                            with the patient's family. Justice Britain, MD 12/21/2022 11:22:10 AM

## 2022-12-21 NOTE — Progress Notes (Signed)
Vs by DT  Pt's states no medical or surgical changes since previsit or office visit.  

## 2022-12-22 ENCOUNTER — Telehealth: Payer: Self-pay | Admitting: *Deleted

## 2022-12-22 NOTE — Telephone Encounter (Signed)
  Follow up Call-     12/21/2022   10:11 AM  Call back number  Post procedure Call Back phone  # [phone]  Permission to leave phone message Yes    [facility]

## 2022-12-26 ENCOUNTER — Ambulatory Visit: Payer: Medicare Other

## 2023-02-22 ENCOUNTER — Encounter: Payer: Self-pay | Admitting: Nurse Practitioner

## 2023-03-07 ENCOUNTER — Ambulatory Visit: Payer: Medicare Other | Admitting: Internal Medicine

## 2023-08-07 ENCOUNTER — Ambulatory Visit: Payer: Medicare Other | Admitting: Nurse Practitioner

## 2023-12-05 ENCOUNTER — Telehealth: Payer: Self-pay | Admitting: *Deleted

## 2023-12-05 NOTE — Telephone Encounter (Signed)
Message left to return call to [place] at [phone].   Patient needs to schedule labs for reclast infusion. Patient lives in [place], does she plan on continuing Reclast infusions at [facility]?
